# Patient Record
Sex: Male | Born: 1965 | Hispanic: No | Marital: Married | Smoking: Never smoker
Health system: Southern US, Community
[De-identification: ages and names within clinical notes are randomized; demographics above are authoritative.]

## PROBLEM LIST (undated history)

## (undated) DIAGNOSIS — I10 Essential (primary) hypertension: Secondary | ICD-10-CM

## (undated) DIAGNOSIS — E119 Type 2 diabetes mellitus without complications: Secondary | ICD-10-CM

## (undated) HISTORY — PX: ANKLE SURGERY: SHX546

## (undated) HISTORY — PX: SHOULDER SURGERY: SHX246

## (undated) HISTORY — DX: Type 2 diabetes mellitus without complications: E11.9

---

## 2016-06-03 DIAGNOSIS — M25512 Pain in left shoulder: Secondary | ICD-10-CM | POA: Diagnosis not present

## 2016-06-03 DIAGNOSIS — I10 Essential (primary) hypertension: Secondary | ICD-10-CM | POA: Diagnosis not present

## 2016-06-03 DIAGNOSIS — R209 Unspecified disturbances of skin sensation: Secondary | ICD-10-CM | POA: Diagnosis not present

## 2016-06-03 DIAGNOSIS — M47812 Spondylosis without myelopathy or radiculopathy, cervical region: Secondary | ICD-10-CM | POA: Diagnosis not present

## 2016-06-03 DIAGNOSIS — M19011 Primary osteoarthritis, right shoulder: Secondary | ICD-10-CM | POA: Diagnosis not present

## 2016-06-09 DIAGNOSIS — M19012 Primary osteoarthritis, left shoulder: Secondary | ICD-10-CM | POA: Diagnosis not present

## 2016-06-09 DIAGNOSIS — M7582 Other shoulder lesions, left shoulder: Secondary | ICD-10-CM | POA: Diagnosis not present

## 2016-06-09 DIAGNOSIS — R600 Localized edema: Secondary | ICD-10-CM | POA: Diagnosis not present

## 2016-08-29 ENCOUNTER — Ambulatory Visit
Admission: RE | Admit: 2016-08-29 | Discharge: 2016-08-29 | Disposition: A | Discharge status: Home / Self Care | Payer: Medicaid Other | Source: Ambulatory Visit | Attending: Cardiovascular Disease | Admitting: Cardiovascular Disease

## 2016-08-29 ENCOUNTER — Other Ambulatory Visit: Payer: Self-pay | Admitting: Cardiovascular Disease

## 2016-08-29 DIAGNOSIS — R51 Headache: Secondary | ICD-10-CM | POA: Diagnosis not present

## 2016-08-29 DIAGNOSIS — G8929 Other chronic pain: Secondary | ICD-10-CM

## 2016-12-23 DIAGNOSIS — I10 Essential (primary) hypertension: Secondary | ICD-10-CM | POA: Diagnosis not present

## 2016-12-23 DIAGNOSIS — R040 Epistaxis: Secondary | ICD-10-CM | POA: Diagnosis not present

## 2016-12-23 DIAGNOSIS — R072 Precordial pain: Secondary | ICD-10-CM | POA: Diagnosis not present

## 2016-12-26 DIAGNOSIS — N429 Disorder of prostate, unspecified: Secondary | ICD-10-CM | POA: Diagnosis not present

## 2016-12-26 DIAGNOSIS — E785 Hyperlipidemia, unspecified: Secondary | ICD-10-CM | POA: Diagnosis not present

## 2016-12-26 DIAGNOSIS — Z79899 Other long term (current) drug therapy: Secondary | ICD-10-CM | POA: Diagnosis not present

## 2016-12-26 DIAGNOSIS — D649 Anemia, unspecified: Secondary | ICD-10-CM | POA: Diagnosis not present

## 2016-12-26 DIAGNOSIS — E559 Vitamin D deficiency, unspecified: Secondary | ICD-10-CM | POA: Diagnosis not present

## 2016-12-26 DIAGNOSIS — E876 Hypokalemia: Secondary | ICD-10-CM | POA: Diagnosis not present

## 2017-02-16 DIAGNOSIS — R072 Precordial pain: Secondary | ICD-10-CM | POA: Diagnosis not present

## 2017-02-16 DIAGNOSIS — R040 Epistaxis: Secondary | ICD-10-CM | POA: Diagnosis not present

## 2017-02-16 DIAGNOSIS — I10 Essential (primary) hypertension: Secondary | ICD-10-CM | POA: Diagnosis not present

## 2017-02-20 ENCOUNTER — Encounter (HOSPITAL_COMMUNITY): Payer: Self-pay | Admitting: *Deleted

## 2017-02-20 ENCOUNTER — Ambulatory Visit (HOSPITAL_COMMUNITY)
Admission: EM | Admit: 2017-02-20 | Discharge: 2017-02-20 | Disposition: A | Discharge status: Home / Self Care | Payer: Medicaid Other | Attending: Internal Medicine | Admitting: Internal Medicine

## 2017-02-20 DIAGNOSIS — M542 Cervicalgia: Secondary | ICD-10-CM

## 2017-02-20 DIAGNOSIS — R040 Epistaxis: Secondary | ICD-10-CM | POA: Diagnosis not present

## 2017-02-20 DIAGNOSIS — S39012A Strain of muscle, fascia and tendon of lower back, initial encounter: Secondary | ICD-10-CM

## 2017-02-20 HISTORY — DX: Essential (primary) hypertension: I10

## 2017-02-20 MED ORDER — NAPROXEN 500 MG PO TABS: 500.0000 mg | ORAL_TABLET | Freq: Two times a day (BID) | ORAL | 0 refills | Status: DC

## 2017-02-20 MED ORDER — OXYMETAZOLINE HCL 0.05 % NA SOLN: 1.0000 | Freq: Two times a day (BID) | NASAL | 0 refills | Status: DC

## 2017-02-20 MED ORDER — CYCLOBENZAPRINE HCL 10 MG PO TABS: 10.0000 mg | ORAL_TABLET | Freq: Two times a day (BID) | ORAL | 0 refills | Status: DC | PRN

## 2017-02-20 NOTE — ED Provider Notes (Signed)
CSN: [id number]     Arrival date & time 02/20/17  1202 History   None    Chief Complaint  Patient presents with  . Headache   (Consider location/radiation/quality/duration/timing/severity/associated sxs/prior Treatment) Patient c/o cervicalgia, back discomfort and nose bleed earlier today but has resolved.  He c/o headache.   The history is provided by the patient.  Headache  Pain location:  Occipital Quality:  Dull Severity currently:  5/10 Severity at highest:  5/10 Onset quality:  Sudden Duration:  1 week Timing:  Constant Chronicity:  New Similar to prior headaches: no   Relieved by:  None tried   Past Medical History:  Diagnosis Date  . Hypertension    Past Surgical History:  Procedure Laterality Date  . SHOULDER SURGERY     No family history on file. Social History  Substance Use Topics  . Smoking status: Never Smoker  . Smokeless tobacco: Not on file  . Alcohol use No    Review of Systems  Constitutional: Negative.   HENT: Negative.   Eyes: Negative.   Respiratory: Negative.   Cardiovascular: Negative.   Gastrointestinal: Negative.   Endocrine: Negative.   Genitourinary: Negative.   Musculoskeletal: Positive for arthralgias.  Skin: Negative.   Allergic/Immunologic: Negative.   Neurological: Positive for headaches.  Hematological: Negative.   Psychiatric/Behavioral: Negative.     Allergies  Patient has no known allergies.  Home Medications   Prior to Admission medications   Medication Sig Start Date End Date Taking? Authorizing Provider  cyclobenzaprine (FLEXERIL) 10 MG tablet Take 1 tablet (10 mg total) by mouth 2 (two) times daily as needed for muscle spasms. 02/20/17   Deatra Canter, FNP  naproxen (NAPROSYN) 500 MG tablet Take 1 tablet (500 mg total) by mouth 2 (two) times daily with a meal. 02/20/17   Inri Sobieski, Anselm Pancoast, FNP  oxymetazoline (AFRIN NASAL SPRAY) 0.05 % nasal spray Place 1 spray into both nostrils 2 (two) times daily. 02/20/17    Deatra Canter, FNP   Meds Ordered and Administered this Visit  Medications - No data to display  BP 108/62 (BP Location: Right Arm)   Pulse 78   Temp 98.6 F (37 C) (Oral)   Resp 18   SpO2 100%  No data found.   Physical Exam  Constitutional: He is oriented to person, place, and time. He appears well-developed and well-nourished.  HENT:  Head: Normocephalic and atraumatic.  Right Ear: External ear normal.  Left Ear: External ear normal.  Nose: Nose normal.  Mouth/Throat: Oropharynx is clear and moist.  Eyes: Conjunctivae and EOM are normal. Pupils are equal, round, and reactive to light.  Neck: Normal range of motion. Neck supple.  Cardiovascular: Normal rate, regular rhythm and normal heart sounds.   Pulmonary/Chest: Effort normal and breath sounds normal.  Musculoskeletal: He exhibits tenderness.  TTP bilateral cervical paraspinous muscles and TTP bilateral lumbar paraspinous muscles.  Neurological: He is alert and oriented to person, place, and time.  Nursing note and vitals reviewed.   Urgent Care Course     Procedures (including critical care time)  Labs Review Labs Reviewed - No data to display  Imaging Review No results found.   Visual Acuity Review  Right Eye Distance:   Left Eye Distance:   Bilateral Distance:    Right Eye Near:   Left Eye Near:    Bilateral Near:         MDM   1. Bleeding from the nose   2. Cervicalgia  3. Strain of lumbar region, initial encounter    Naprosyn 500mg  one po bid x10 days Flexeril 10 mg one po bid prn #20  Afrin nasal sprays 1-3 sprays per nostril bid prn nose bleed.  Explained at this time do not blow nose and can use the afrin and if nose bleed does not stop go to ED.      Deatra CanterOxford, Jaquelin Meaney J, FNP 02/20/17 1336

## 2017-02-20 NOTE — ED Triage Notes (Signed)
headcahe   And  Nosebleed   X  1   Week   First  Time   With     Nosebleed   Bleed earlier  Today         Pt  Reports  The  Bleeding  Is  Non  Stop

## 2017-02-20 NOTE — ED Triage Notes (Signed)
Pt   Was   Taking   bp meds   But  Left  Them  At  Home    And   Did  Not  Bring       Pt  Took  His  bp meds  Today

## 2017-02-24 DIAGNOSIS — I10 Essential (primary) hypertension: Secondary | ICD-10-CM | POA: Diagnosis not present

## 2017-02-24 DIAGNOSIS — R040 Epistaxis: Secondary | ICD-10-CM | POA: Diagnosis not present

## 2017-02-24 DIAGNOSIS — R072 Precordial pain: Secondary | ICD-10-CM | POA: Diagnosis not present

## 2017-03-11 DIAGNOSIS — R040 Epistaxis: Secondary | ICD-10-CM | POA: Diagnosis not present

## 2017-08-17 ENCOUNTER — Encounter (HOSPITAL_COMMUNITY): Payer: Self-pay | Admitting: Family Medicine

## 2017-08-17 ENCOUNTER — Ambulatory Visit (HOSPITAL_COMMUNITY)
Admission: EM | Admit: 2017-08-17 | Discharge: 2017-08-17 | Disposition: A | Discharge status: Home / Self Care | Payer: Medicare Other | Attending: Emergency Medicine | Admitting: Emergency Medicine

## 2017-08-17 DIAGNOSIS — G44209 Tension-type headache, unspecified, not intractable: Secondary | ICD-10-CM | POA: Diagnosis not present

## 2017-08-17 DIAGNOSIS — H6121 Impacted cerumen, right ear: Secondary | ICD-10-CM

## 2017-08-17 DIAGNOSIS — H8111 Benign paroxysmal vertigo, right ear: Secondary | ICD-10-CM | POA: Diagnosis not present

## 2017-08-17 DIAGNOSIS — H9201 Otalgia, right ear: Secondary | ICD-10-CM

## 2017-08-17 MED ORDER — NAPROXEN 375 MG PO TABS: 375.0000 mg | ORAL_TABLET | Freq: Two times a day (BID) | ORAL | 0 refills | Status: DC

## 2017-08-17 MED ORDER — MECLIZINE HCL 25 MG PO TABS: 25.0000 mg | ORAL_TABLET | Freq: Three times a day (TID) | ORAL | 0 refills | Status: DC | PRN

## 2017-08-17 NOTE — Discharge Instructions (Signed)
Take medications as directed. Take the medicine for headache with food. Drink plenty fluids and stay well-hydrated. Follow up with your doctor as needed in the next few days.

## 2017-08-17 NOTE — ED Triage Notes (Signed)
Pt here for right ear pain, headache, dizziness and weakness. sts some nausea and fever.

## 2017-08-17 NOTE — ED Provider Notes (Signed)
[facility]    CSN: [id number] Arrival date & time: 08/17/17  1445     History   Chief Complaint Chief Complaint  Patient presents with  . Otalgia  . Hearing Problem    HPI Todd Ochoa is a 51 y.o. male.   Via interpreter in the room patient is a 51 year old male complaining of pain in the right ear with decrease in hearing. Is also having a headache starting from the posterior neck around the occiput toward the vertex of the head.  He feels weak. Symptoms  ongoing for about 10 days. Denies fever or chills. No sore throat. Complains of dizziness and occasionally feeling of room spinning particularly with head movement and when lying supine.      Past Medical History:  Diagnosis Date  . Hypertension     There are no active problems to display for this patient.   Past Surgical History:  Procedure Laterality Date  . SHOULDER SURGERY         Home Medications    Prior to Admission medications   Medication Sig Start Date End Date Taking? Authorizing Provider  meclizine (ANTIVERT) 25 MG tablet Take 1 tablet (25 mg total) by mouth 3 (three) times daily as needed for dizziness. 08/17/17   Hayden RasmussenMabe, Jamin Panther, NP  naproxen (NAPROSYN) 375 MG tablet Take 1 tablet (375 mg total) by mouth 2 (two) times daily. Prn headache 08/17/17   Hayden RasmussenMabe, Charron Coultas, NP  oxymetazoline (AFRIN NASAL SPRAY) 0.05 % nasal spray Place 1 spray into both nostrils 2 (two) times daily. 02/20/17   Deatra Canterxford, William J, FNP    Family History History reviewed. No pertinent family history.  Social History Social History   Tobacco Use  . Smoking status: Never Smoker  Substance Use Topics  . Alcohol use: No  . Drug use: Not on file     Allergies   Patient has no known allergies.   Review of Systems Review of Systems  Constitutional: Positive for activity change. Negative for fatigue and fever.  HENT: Positive for ear pain. Negative for congestion and sore throat.   Respiratory: Negative.     Cardiovascular: Negative.   Musculoskeletal: Positive for neck pain.  Neurological: Positive for dizziness and headaches.  All other systems reviewed and are negative.    Physical Exam Triage Vital Signs ED Triage Vitals  Enc Vitals Group     BP 08/17/17 1528 104/89     Pulse Rate 08/17/17 1528 65     Resp 08/17/17 1528 18     Temp 08/17/17 1528 98.1 F (36.7 C)     Temp src --      SpO2 08/17/17 1528 100 %     Weight --      Height --      Head Circumference --      Peak Flow --      Pain Score 08/17/17 1525 7     Pain Loc --      Pain Edu? --      Excl. in GC? --    No data found.  Updated Vital Signs BP 104/89   Pulse 65   Temp 98.1 F (36.7 C)   Resp 18   SpO2 100%   Visual Acuity Right Eye Distance:   Left Eye Distance:   Bilateral Distance:    Right Eye Near:   Left Eye Near:    Bilateral Near:     Physical Exam  Constitutional: He appears well-developed and well-nourished. No distress.  HENT:  Mouth/Throat: Oropharynx is clear and moist. No oropharyngeal exudate.  Left EAC with loose cerumen with minimal visualization the left ear. Right EAC with cerumen impaction and unable to visualize TM.  Oropharynx is clear.  Eyes: EOM are normal.  Neck: Normal range of motion. Neck supple.  No cervical tenderness  Cardiovascular: Normal rate, regular rhythm, normal heart sounds and intact distal pulses.  Pulmonary/Chest: Effort normal and breath sounds normal. No stridor. No respiratory distress. He has no wheezes.  Musculoskeletal: He exhibits no edema.  Lymphadenopathy:    He has no cervical adenopathy.  Neurological: He is alert.  Skin: Skin is warm and dry. No rash noted.  Nursing note and vitals reviewed.    UC Treatments / Results  Labs (all labs ordered are listed, but only abnormal results are displayed) Labs Reviewed - No data to display  EKG  EKG Interpretation None       Radiology No results found.  Procedures Procedures  (including critical care time)  Medications Ordered in UC Medications - No data to display   Initial Impression / Assessment and Plan / UC Course  I have reviewed the triage vital signs and the nursing notes.  Pertinent labs & imaging results that were available during my care of the patient were reviewed by me and considered in my medical decision making (see chart for details).    Take medications as directed. Take the medicine for headache with food. Drink plenty fluids and stay well-hydrated. Follow up with your doctor as needed in the next few days.  Right ear canal was cleaned well. TM a little dull. No evidence of effusion or infection. The patient does complain of tinnitus.    Final Clinical Impressions(s) / UC Diagnoses   Final diagnoses:  Right ear pain  Impacted cerumen of right ear  BPV (benign positional vertigo), right  Tension headache    ED Discharge Orders        Ordered    naproxen (NAPROSYN) 375 MG tablet  2 times daily     08/17/17 1727    meclizine (ANTIVERT) 25 MG tablet  3 times daily PRN     08/17/17 1727       Controlled Substance Prescriptions [place] Controlled Substance Registry consulted? Not Applicable   Hayden RasmussenMabe, Meleana Commerford, NP 08/17/17 1730

## 2017-11-05 ENCOUNTER — Other Ambulatory Visit: Payer: Self-pay

## 2017-11-05 ENCOUNTER — Ambulatory Visit (HOSPITAL_COMMUNITY)
Admission: EM | Admit: 2017-11-05 | Discharge: 2017-11-05 | Disposition: A | Discharge status: Home / Self Care | Payer: Medicare Other | Attending: Family Medicine | Admitting: Family Medicine

## 2017-11-05 ENCOUNTER — Encounter (HOSPITAL_COMMUNITY): Payer: Self-pay | Admitting: Emergency Medicine

## 2017-11-05 DIAGNOSIS — H8110 Benign paroxysmal vertigo, unspecified ear: Secondary | ICD-10-CM | POA: Diagnosis not present

## 2017-11-05 DIAGNOSIS — H6691 Otitis media, unspecified, right ear: Secondary | ICD-10-CM | POA: Diagnosis not present

## 2017-11-05 MED ORDER — MECLIZINE HCL 25 MG PO TABS: 25.0000 mg | ORAL_TABLET | Freq: Three times a day (TID) | ORAL | 0 refills | Status: DC | PRN

## 2017-11-05 MED ORDER — AMOXICILLIN 500 MG PO CAPS: 500.0000 mg | ORAL_CAPSULE | Freq: Two times a day (BID) | ORAL | 0 refills | Status: AC

## 2017-11-05 NOTE — ED Provider Notes (Signed)
[facility]    CSN: [id number] Arrival date & time: 11/05/17  1001     History   Chief Complaint Chief Complaint  Patient presents with  . Otalgia    HPI Todd Ochoa is a 52 y.o. male.   Subjective:   Todd Ochoa is a 52 y.o. male who presents with right ear pain. He also endorses nausea, headache and feeling off balance at times. Onset of symptoms was 2 weeks ago and has been unchanged since that time. Patient also reports an episode of dizziness with fall 2 days ago. He denies any difficulty hearing, congestion, rhinorrhea, sore throat, loss of consciousness, confusion limb paraesthesias, unilateral weakness or slurred speech.  No neck or back pain. He is drinking plenty of fluids  The following portions of the patient's history were reviewed and updated as appropriate: allergies, current medications, past family history, past medical history, past social history, past surgical history and problem list.         Past Medical History:  Diagnosis Date  . Hypertension     There are no active problems to display for this patient.   Past Surgical History:  Procedure Laterality Date  . SHOULDER SURGERY         Home Medications    Prior to Admission medications   Medication Sig Start Date End Date Taking? Authorizing Provider  meclizine (ANTIVERT) 25 MG tablet Take 1 tablet (25 mg total) by mouth 3 (three) times daily as needed for dizziness. 08/17/17   Hayden Rasmussen, NP  naproxen (NAPROSYN) 375 MG tablet Take 1 tablet (375 mg total) by mouth 2 (two) times daily. Prn headache 08/17/17   Hayden Rasmussen, NP  oxymetazoline (AFRIN NASAL SPRAY) 0.05 % nasal spray Place 1 spray into both nostrils 2 (two) times daily. 02/20/17   Deatra Canter, FNP    Family History No family history on file.  Social History Social History   Tobacco Use  . Smoking status: Never Smoker  . Smokeless tobacco: Never Used  Substance Use Topics  . Alcohol use: No  . Drug use:  Never     Allergies   Patient has no known allergies.   Review of Systems Review of Systems  Constitutional: Negative for fever.  HENT: Positive for ear pain. Negative for congestion, ear discharge, rhinorrhea, sneezing and tinnitus.   Respiratory: Negative for cough and shortness of breath.   Cardiovascular: Negative for chest pain.  Neurological: Positive for dizziness and headaches. Negative for tremors, seizures, syncope, facial asymmetry, speech difficulty, weakness and numbness.  All other systems reviewed and are negative.    Physical Exam Triage Vital Signs ED Triage Vitals [11/05/17 1030]  Enc Vitals Group     BP 124/82     Pulse Rate 65     Resp 16     Temp 97.7 F (36.5 C)     Temp Source Oral     SpO2 100 %     Weight      Height      Head Circumference      Peak Flow      Pain Score      Pain Loc      Pain Edu?      Excl. in GC?    No data found.  Updated Vital Signs BP 124/82 (BP Location: Left Arm)   Pulse 65   Temp 97.7 F (36.5 C) (Oral)   Resp 16   SpO2 100%   Visual Acuity Right Eye  Distance:   Left Eye Distance:   Bilateral Distance:    Right Eye Near:   Left Eye Near:    Bilateral Near:     Physical Exam  Constitutional: He is oriented to person, place, and time. He appears well-developed and well-nourished.  HENT:  Head: Normocephalic and atraumatic.  Left Ear: External ear normal.  Nose: Nose normal.  Mouth/Throat: Oropharynx is clear and moist.  Right TM redness   Eyes: Pupils are equal, round, and reactive to light. Conjunctivae and EOM are normal.  Neck: Normal range of motion. Neck supple.  Cardiovascular: Normal rate, regular rhythm and normal heart sounds.  Pulmonary/Chest: Effort normal and breath sounds normal.  Musculoskeletal: Normal range of motion.  Neurological: He is alert and oriented to person, place, and time. No cranial nerve deficit or sensory deficit. Coordination normal.  Skin: Skin is warm and dry.    Psychiatric: He has a normal mood and affect.     UC Treatments / Results  Labs (all labs ordered are listed, but only abnormal results are displayed) Labs Reviewed - No data to display  EKG  EKG Interpretation None       Radiology No results found.  Procedures Procedures (including critical care time)  Medications Ordered in UC Medications - No data to display   Initial Impression / Assessment and Plan / UC Course  I have reviewed the triage vital signs and the nursing notes.  Pertinent labs & imaging results that were available during my care of the patient were reviewed by me and considered in my medical decision making (see chart for details).     52 year old male presenting with right ear pain, nausea, headache and dizziness. Right TM redness consistent with acute otitis media. No focal neuro deficits noted on exam. Will treat with amoxicillin as well as meclizine as needed. Discussed diagnosis and treatment with patient. All questions have been answered and all concerns have been addressed. The patient verbalized understanding and had no further questions   Final Clinical Impressions(s) / UC Diagnoses   Final diagnoses:  Right otitis media, unspecified otitis media type  Benign paroxysmal positional vertigo, unspecified laterality    ED Discharge Orders    None       Controlled Substance Prescriptions [place] Controlled Substance Registry consulted? Not Applicable   Lurline IdolMurrill, Vondell Sowell, FNP 11/05/17 1120

## 2017-11-05 NOTE — ED Triage Notes (Signed)
C/o right ear pain, HA and dizziness onset "over a year"

## 2018-06-04 ENCOUNTER — Emergency Department (HOSPITAL_COMMUNITY): Payer: Medicare Other

## 2018-06-04 ENCOUNTER — Encounter (HOSPITAL_COMMUNITY): Payer: Self-pay | Admitting: Emergency Medicine

## 2018-06-04 ENCOUNTER — Emergency Department (HOSPITAL_COMMUNITY)
Admission: EM | Admit: 2018-06-04 | Discharge: 2018-06-04 | Disposition: A | Discharge status: Home / Self Care | Payer: Medicare Other | Attending: Emergency Medicine | Admitting: Emergency Medicine

## 2018-06-04 DIAGNOSIS — I10 Essential (primary) hypertension: Secondary | ICD-10-CM | POA: Insufficient documentation

## 2018-06-04 DIAGNOSIS — Y998 Other external cause status: Secondary | ICD-10-CM | POA: Insufficient documentation

## 2018-06-04 DIAGNOSIS — R0789 Other chest pain: Secondary | ICD-10-CM | POA: Diagnosis not present

## 2018-06-04 DIAGNOSIS — S0990XA Unspecified injury of head, initial encounter: Secondary | ICD-10-CM | POA: Diagnosis not present

## 2018-06-04 DIAGNOSIS — R42 Dizziness and giddiness: Secondary | ICD-10-CM | POA: Insufficient documentation

## 2018-06-04 DIAGNOSIS — Y93I9 Activity, other involving external motion: Secondary | ICD-10-CM | POA: Diagnosis not present

## 2018-06-04 DIAGNOSIS — S161XXA Strain of muscle, fascia and tendon at neck level, initial encounter: Secondary | ICD-10-CM | POA: Insufficient documentation

## 2018-06-04 DIAGNOSIS — S199XXA Unspecified injury of neck, initial encounter: Secondary | ICD-10-CM | POA: Diagnosis not present

## 2018-06-04 DIAGNOSIS — S299XXA Unspecified injury of thorax, initial encounter: Secondary | ICD-10-CM | POA: Diagnosis not present

## 2018-06-04 DIAGNOSIS — Y92410 Unspecified street and highway as the place of occurrence of the external cause: Secondary | ICD-10-CM | POA: Diagnosis not present

## 2018-06-04 DIAGNOSIS — R079 Chest pain, unspecified: Secondary | ICD-10-CM | POA: Diagnosis not present

## 2018-06-04 DIAGNOSIS — M542 Cervicalgia: Secondary | ICD-10-CM | POA: Diagnosis not present

## 2018-06-04 MED ORDER — TRAMADOL HCL 50 MG PO TABS: 50.0000 mg | ORAL_TABLET | Freq: Four times a day (QID) | ORAL | 0 refills | Status: DC | PRN

## 2018-06-04 MED ORDER — IBUPROFEN 800 MG PO TABS: 800.0000 mg | ORAL_TABLET | Freq: Three times a day (TID) | ORAL | 0 refills | Status: DC | PRN

## 2018-06-04 MED ORDER — IBUPROFEN 800 MG PO TABS
800.0000 mg | ORAL_TABLET | Freq: Once | ORAL | Status: AC
  Administered 2018-06-04: 800 mg via ORAL
  Filled 2018-06-04: qty 1
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
## 2018-06-04 NOTE — Discharge Instructions (Signed)
Your CT scans and x-rays were normal.  Return here as needed.  Follow-up with her primary doctor.
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
## 2022-02-14 ENCOUNTER — Emergency Department (HOSPITAL_COMMUNITY): Payer: Medicare Other

## 2022-02-14 ENCOUNTER — Inpatient Hospital Stay (HOSPITAL_COMMUNITY)
Admission: EM | Admit: 2022-02-14 | Discharge: 2022-02-17 | DRG: 419 | Disposition: A | Discharge status: Home / Self Care | Payer: Medicare Other | Attending: Surgery | Admitting: Surgery

## 2022-02-14 ENCOUNTER — Encounter (HOSPITAL_COMMUNITY): Payer: Self-pay | Admitting: *Deleted

## 2022-02-14 ENCOUNTER — Ambulatory Visit (HOSPITAL_COMMUNITY)
Admission: EM | Admit: 2022-02-14 | Discharge: 2022-02-14 | Disposition: A | Discharge status: Home / Self Care | Payer: Medicare Other | Attending: Student | Admitting: Student

## 2022-02-14 ENCOUNTER — Other Ambulatory Visit: Payer: Self-pay

## 2022-02-14 ENCOUNTER — Encounter (HOSPITAL_COMMUNITY): Payer: Self-pay

## 2022-02-14 DIAGNOSIS — K810 Acute cholecystitis: Secondary | ICD-10-CM | POA: Diagnosis not present

## 2022-02-14 DIAGNOSIS — K82A1 Gangrene of gallbladder in cholecystitis: Secondary | ICD-10-CM | POA: Diagnosis present

## 2022-02-14 DIAGNOSIS — K819 Cholecystitis, unspecified: Principal | ICD-10-CM

## 2022-02-14 DIAGNOSIS — R1011 Right upper quadrant pain: Secondary | ICD-10-CM

## 2022-02-14 DIAGNOSIS — R079 Chest pain, unspecified: Secondary | ICD-10-CM | POA: Diagnosis not present

## 2022-02-14 DIAGNOSIS — I10 Essential (primary) hypertension: Secondary | ICD-10-CM | POA: Diagnosis present

## 2022-02-14 DIAGNOSIS — K8020 Calculus of gallbladder without cholecystitis without obstruction: Secondary | ICD-10-CM | POA: Diagnosis not present

## 2022-02-14 DIAGNOSIS — K8000 Calculus of gallbladder with acute cholecystitis without obstruction: Secondary | ICD-10-CM | POA: Diagnosis not present

## 2022-02-14 DIAGNOSIS — R1013 Epigastric pain: Secondary | ICD-10-CM | POA: Diagnosis not present

## 2022-02-14 LAB — CBC
HCT: 48.9 % (ref 39.0–52.0)
Hemoglobin: 16.3 g/dL (ref 13.0–17.0)
MCH: 29.1 pg (ref 26.0–34.0)
MCHC: 33.3 g/dL (ref 30.0–36.0)
MCV: 87.2 fL (ref 80.0–100.0)
Platelets: 208 10*3/uL (ref 150–400)
RBC: 5.61 MIL/uL (ref 4.22–5.81)
RDW: 12.3 % (ref 11.5–15.5)
WBC: 10.9 10*3/uL — ABNORMAL HIGH (ref 4.0–10.5)
nRBC: 0 % (ref 0.0–0.2)

## 2022-02-14 LAB — URINALYSIS, ROUTINE W REFLEX MICROSCOPIC
Bacteria, UA: NONE SEEN
Bilirubin Urine: NEGATIVE
Glucose, UA: 50 mg/dL — AB
Hgb urine dipstick: NEGATIVE
Ketones, ur: 5 mg/dL — AB
Leukocytes,Ua: NEGATIVE
Nitrite: NEGATIVE
Protein, ur: 30 mg/dL — AB
Specific Gravity, Urine: 1.03 (ref 1.005–1.030)
pH: 7 (ref 5.0–8.0)

## 2022-02-14 LAB — COMPREHENSIVE METABOLIC PANEL
ALT: 19 U/L (ref 0–44)
AST: 20 U/L (ref 15–41)
Albumin: 4.1 g/dL (ref 3.5–5.0)
Alkaline Phosphatase: 55 U/L (ref 38–126)
Anion gap: 9 (ref 5–15)
BUN: 8 mg/dL (ref 6–20)
CO2: 27 mmol/L (ref 22–32)
Calcium: 9.1 mg/dL (ref 8.9–10.3)
Chloride: 104 mmol/L (ref 98–111)
Creatinine, Ser: 0.74 mg/dL (ref 0.61–1.24)
GFR, Estimated: >60 mL/min (ref >60)
Glucose, Bld: 129 mg/dL — ABNORMAL HIGH (ref 70–99)
Potassium: 3.4 mmol/L — ABNORMAL LOW (ref 3.5–5.1)
Sodium: 140 mmol/L (ref 135–145)
Total Bilirubin: 0.5 mg/dL (ref 0.3–1.2)
Total Protein: 7.6 g/dL (ref 6.5–8.1)

## 2022-02-14 LAB — TROPONIN I (HIGH SENSITIVITY)
Troponin I (High Sensitivity): 10 ng/L (ref <18)
Troponin I (High Sensitivity): 7 ng/L (ref <18)

## 2022-02-14 LAB — LIPASE, BLOOD: Lipase: 32 U/L (ref 11–51)

## 2022-02-14 MED ORDER — PIPERACILLIN-TAZOBACTAM 3.375 G IVPB 30 MIN
3.3750 g | Freq: Once | INTRAVENOUS | Status: AC
  Administered 2022-02-14: 3.375 g via INTRAVENOUS
  Filled 2022-02-14: qty 50

## 2022-02-14 MED ORDER — DIPHENHYDRAMINE HCL 50 MG/ML IJ SOLN: 25.0000 mg | Freq: Four times a day (QID) | INTRAMUSCULAR | Status: DC | PRN

## 2022-02-14 MED ORDER — FENTANYL CITRATE PF 50 MCG/ML IJ SOSY
50.0000 ug | PREFILLED_SYRINGE | Freq: Once | INTRAMUSCULAR | Status: AC
  Administered 2022-02-14: 50 ug via INTRAVENOUS
  Filled 2022-02-14: qty 1

## 2022-02-14 MED ORDER — ONDANSETRON HCL 4 MG/2ML IJ SOLN: 4.0000 mg | Freq: Four times a day (QID) | INTRAMUSCULAR | Status: DC | PRN

## 2022-02-14 MED ORDER — PIPERACILLIN-TAZOBACTAM 3.375 G IVPB
3.3750 g | Freq: Three times a day (TID) | INTRAVENOUS | Status: DC
  Administered 2022-02-15 – 2022-02-17 (×7): 3.375 g via INTRAVENOUS
  Filled 2022-02-14 (×7): qty 50

## 2022-02-14 MED ORDER — METOPROLOL TARTRATE 5 MG/5ML IV SOLN: 5.0000 mg | Freq: Four times a day (QID) | INTRAVENOUS | Status: DC | PRN

## 2022-02-14 MED ORDER — METHOCARBAMOL 1000 MG/10ML IJ SOLN: 500.0000 mg | Freq: Four times a day (QID) | INTRAVENOUS | Status: DC | PRN

## 2022-02-14 MED ORDER — TRAMADOL HCL 50 MG PO TABS
50.0000 mg | ORAL_TABLET | Freq: Four times a day (QID) | ORAL | Status: DC | PRN
  Administered 2022-02-17: 50 mg via ORAL
  Filled 2022-02-14: qty 1

## 2022-02-14 MED ORDER — PROCHLORPERAZINE EDISYLATE 10 MG/2ML IJ SOLN: 5.0000 mg | Freq: Four times a day (QID) | INTRAMUSCULAR | Status: DC | PRN

## 2022-02-14 MED ORDER — MORPHINE SULFATE (PF) 4 MG/ML IV SOLN
4.0000 mg | Freq: Once | INTRAVENOUS | Status: AC
  Administered 2022-02-14: 4 mg via INTRAVENOUS
  Filled 2022-02-14: qty 1

## 2022-02-14 MED ORDER — HYDRALAZINE HCL 20 MG/ML IJ SOLN: 10.0000 mg | INTRAMUSCULAR | Status: DC | PRN

## 2022-02-14 MED ORDER — OXYCODONE-ACETAMINOPHEN 5-325 MG PO TABS
1.0000 | ORAL_TABLET | Freq: Once | ORAL | Status: AC
  Administered 2022-02-14: 1 via ORAL
  Filled 2022-02-14: qty 1

## 2022-02-14 MED ORDER — ONDANSETRON HCL 4 MG/2ML IJ SOLN
4.0000 mg | Freq: Once | INTRAMUSCULAR | Status: AC
  Administered 2022-02-14: 4 mg via INTRAVENOUS
  Filled 2022-02-14: qty 2

## 2022-02-14 MED ORDER — HYDROMORPHONE HCL 1 MG/ML IJ SOLN
0.5000 mg | INTRAMUSCULAR | Status: DC | PRN
  Administered 2022-02-14 – 2022-02-16 (×10): 1 mg via INTRAVENOUS
  Filled 2022-02-14 (×10): qty 1

## 2022-02-14 MED ORDER — BISACODYL 10 MG RE SUPP: 10.0000 mg | Freq: Every day | RECTAL | Status: DC | PRN

## 2022-02-14 MED ORDER — ONDANSETRON 4 MG PO TBDP: 4.0000 mg | ORAL_TABLET | Freq: Four times a day (QID) | ORAL | Status: DC | PRN

## 2022-02-14 MED ORDER — ENOXAPARIN SODIUM 40 MG/0.4ML IJ SOSY
40.0000 mg | PREFILLED_SYRINGE | INTRAMUSCULAR | Status: DC
  Administered 2022-02-14 – 2022-02-16 (×3): 40 mg via SUBCUTANEOUS
  Filled 2022-02-14 (×3): qty 0.4

## 2022-02-14 MED ORDER — SIMETHICONE 80 MG PO CHEW: 40.0000 mg | CHEWABLE_TABLET | Freq: Four times a day (QID) | ORAL | Status: DC | PRN

## 2022-02-14 MED ORDER — ACETAMINOPHEN 500 MG PO TABS
1000.0000 mg | ORAL_TABLET | Freq: Four times a day (QID) | ORAL | Status: DC
  Administered 2022-02-14 – 2022-02-17 (×9): 1000 mg via ORAL
  Filled 2022-02-14 (×9): qty 2

## 2022-02-14 MED ORDER — OXYCODONE HCL 5 MG PO TABS: 5.0000 mg | ORAL_TABLET | ORAL | Status: DC | PRN

## 2022-02-14 MED ORDER — PANTOPRAZOLE SODIUM 40 MG IV SOLR
40.0000 mg | Freq: Every day | INTRAVENOUS | Status: DC
  Administered 2022-02-14 – 2022-02-16 (×3): 40 mg via INTRAVENOUS
  Filled 2022-02-14 (×3): qty 10

## 2022-02-14 MED ORDER — PROCHLORPERAZINE MALEATE 10 MG PO TABS: 10.0000 mg | ORAL_TABLET | Freq: Four times a day (QID) | ORAL | Status: DC | PRN

## 2022-02-14 MED ORDER — DOCUSATE SODIUM 100 MG PO CAPS
100.0000 mg | ORAL_CAPSULE | Freq: Two times a day (BID) | ORAL | Status: DC
  Administered 2022-02-14 – 2022-02-17 (×4): 100 mg via ORAL
  Filled 2022-02-14 (×5): qty 1

## 2022-02-14 MED ORDER — KETOROLAC TROMETHAMINE 15 MG/ML IJ SOLN
15.0000 mg | Freq: Four times a day (QID) | INTRAMUSCULAR | Status: DC | PRN
  Administered 2022-02-14 – 2022-02-15 (×2): 15 mg via INTRAVENOUS
  Filled 2022-02-14 (×2): qty 1

## 2022-02-14 MED ORDER — DIPHENHYDRAMINE HCL 25 MG PO CAPS: 25.0000 mg | ORAL_CAPSULE | Freq: Four times a day (QID) | ORAL | Status: DC | PRN

## 2022-02-14 MED ORDER — SODIUM CHLORIDE 0.9 % IV SOLN: INTRAVENOUS | Status: DC

## 2022-02-14 NOTE — ED Provider Triage Note (Signed)
Emergency Medicine Provider Triage Evaluation Note  Todd Ochoa , a 56 y.o. male  was evaluated in triage.  Pt complains of 2 weeks of right upper quadrant/epigastric pain seems to radiate to his back.  It seems that his pain is primarily below the xiphoid.  He denies any shortness of breath lightheadedness or dizziness.  No nausea or vomiting.  Normal bowel movements.  No urinary issues.  Only medical problem is hypertension.  No history of strokes heart attacks or smoking.  Son is at bedside who is translating for patient  Review of Systems  Positive: Chest pain, epigastric pain Negative: Fever  Physical Exam  BP 131/88 (BP Location: Left Arm)   Pulse 72   Temp 97.9 F (36.6 C) (Oral)   Resp 20   Ht 5\' 5"  (1.651 m)   Wt 72.6 kg   SpO2 100%   BMI 26.63 kg/m  Gen:   Awake, no distress   Resp:  Normal effort  MSK:   Moves extremities without difficulty  Other:  Uncomfortable appearing 56 year old venovenous gentleman.  Significantly tender in right upper quadrant with positive Murphy sign.  Medical Decision Making  Medically screening exam initiated at 11:57 AM.  Appropriate orders placed.  Todd Ochoa was informed that the remainder of the evaluation will be completed by another provider, this initial triage assessment does not replace that evaluation, and the importance of remaining in the ED until their evaluation is complete.  [address], [address]     [place], DOLE 02/14/22 1159

## 2022-02-14 NOTE — ED Provider Notes (Signed)
[facility] EMERGENCY DEPARTMENT Provider Note   CSN: [id number] Arrival date & time: 02/14/22  1130     History  Chief Complaint  Patient presents with   Chest Pain    Todd Ochoa is a 56 y.o. male with a past medical history of hypertension.  Patient was seen at urgent care earlier today and sent to the emergency department for further evaluation.  Patient presents to the emergency department with a complaint of right upper quadrant abdominal pain.  Pain has been constant over the last 2 weeks.  Pain is worse over the last 24 hours.  Patient reports that pain in radiates to his back.  Patient has not noticed any aggravating factors or alleviating factors.  Patient has been eating and drinking as normal.  Does report some decreased urinary output as well as decreased stool production.  Denies any fever, chills, nausea, vomiting, constipation, diarrhea, blood in stool, melena, dysuria, hematuria, urinary urgency, swelling or tenderness to genitals.  Patient denies any illicit drug or alcohol use.  No previous history of abdominal surgeries.  No blood thinner use.  Last oral intake was 7:00 this morning.   Chest Pain Associated symptoms: abdominal pain   Associated symptoms: no back pain, no dizziness, no fever, no headache, no nausea, no shortness of breath and no vomiting        Home Medications Prior to Admission medications   Medication Sig Start Date End Date Taking? Authorizing Provider  ibuprofen (ADVIL,MOTRIN) 800 MG tablet Take 1 tablet (800 mg total) by mouth every 8 (eight) hours as needed. 06/04/18   Lawyer, Cristal Deer, PA-C      Allergies    Patient has no known allergies.    Review of Systems   Review of Systems  Constitutional:  Negative for chills and fever.  Eyes:  Negative for visual disturbance.  Respiratory:  Negative for shortness of breath.   Cardiovascular:  Negative for chest pain.  Gastrointestinal:  Positive for abdominal  pain. Negative for abdominal distention, anal bleeding, blood in stool, constipation, diarrhea, nausea, rectal pain and vomiting.  Genitourinary:  Positive for decreased urine volume. Negative for difficulty urinating, dysuria, flank pain, frequency, hematuria, penile pain, penile swelling, scrotal swelling and testicular pain.  Musculoskeletal:  Negative for back pain and neck pain.  Skin:  Negative for color change and rash.  Neurological:  Negative for dizziness, syncope, light-headedness and headaches.  Psychiatric/Behavioral:  Negative for confusion.     Physical Exam Updated Vital Signs BP 131/88 (BP Location: Left Arm)   Pulse 72   Temp 97.9 F (36.6 C) (Oral)   Resp 20   Ht 5\' 5"  (1.651 m)   Wt 72.6 kg   SpO2 100%   BMI 26.63 kg/m  Physical Exam Vitals and nursing note reviewed.  Constitutional:      General: He is not in acute distress.    Appearance: He is not ill-appearing, toxic-appearing or diaphoretic.  HENT:     Head: Normocephalic.  Eyes:     General: No scleral icterus.       Right eye: No discharge.        Left eye: No discharge.  Cardiovascular:     Rate and Rhythm: Normal rate.  Pulmonary:     Effort: Pulmonary effort is normal.  Abdominal:     General: Abdomen is flat. Bowel sounds are decreased. There is no distension. There are no signs of injury.     Palpations: Abdomen is soft. There is  no mass or pulsatile mass.     Tenderness: There is abdominal tenderness in the right upper quadrant. There is guarding. There is no right CVA tenderness, left CVA tenderness or rebound. Positive signs include Murphy's sign.     Hernia: There is no hernia in the umbilical area or ventral area.  Skin:    General: Skin is warm and dry.  Neurological:     General: No focal deficit present.     Mental Status: He is alert.     GCS: GCS eye subscore is 4. GCS verbal subscore is 5. GCS motor subscore is 6.  Psychiatric:        Behavior: Behavior is cooperative.      ED Results / Procedures / Treatments   Labs (all labs ordered are listed, but only abnormal results are displayed) Labs Reviewed  COMPREHENSIVE METABOLIC PANEL - Abnormal; Notable for the following components:      Result Value   Potassium 3.4 (*)    Glucose, Bld 129 (*)    All other components within normal limits  CBC - Abnormal; Notable for the following components:   WBC 10.9 (*)    All other components within normal limits  URINALYSIS, ROUTINE W REFLEX MICROSCOPIC - Abnormal; Notable for the following components:   Glucose, UA 50 (*)    Ketones, ur 5 (*)    Protein, ur 30 (*)    All other components within normal limits  LIPASE, BLOOD  TROPONIN I (HIGH SENSITIVITY)  TROPONIN I (HIGH SENSITIVITY)    EKG None  Radiology US Abdomen Limited RUQ (LIVER/GB)  Result Date: 02/14/2022 CLINICAL DATA:  Right upper quadrant pain EXAM: ULTRASOUND ABDOMEN LIMITED RIGHT UPPER QUADRANT COMPARISON:  None Available. FINDINGS: Gallbladder: Gallbladder is physiologically distended containing exudative material. 1.8 cm gallstone in the gallbladder neck. Wall thickening up to 3.3 mm. Trace pericholecystic fluid. Sonographer reported sonographic Murphy's sign. Common bile duct: Diameter: 7 mm, unremarkable. No intrahepatic biliary ductal dilatation noted. Liver: No focal lesion identified. Within normal limits in parenchymal echogenicity. Portal vein is patent on color Doppler imaging with normal direction of blood flow towards the liver. Other: None. IMPRESSION: Cholelithiasis with mild gallbladder wall thickening, pericholecystic fluid, and reported positive sonographic Murphy's sign suggesting cholecystitis. Electronically Signed   By: Corlis Leak M.D.   On: 02/14/2022 13:02   DG Chest 2 View  Result Date: 02/14/2022 CLINICAL DATA:  Chest pain EXAM: CHEST - 2 VIEW COMPARISON:  Chest radiographs 06/04/2018 FINDINGS: The cardiomediastinal silhouette is normal. There is no focal consolidation or  pulmonary edema. There is no pleural effusion or pneumothorax There is no acute osseous abnormality. Postsurgical changes of the left clavicle are unchanged. IMPRESSION: No radiographic evidence of acute cardiopulmonary process. Electronically Signed   By: Lesia Hausen M.D.   On: 02/14/2022 12:17    Procedures Procedures    Medications Ordered in ED Medications  oxyCODONE-acetaminophen (PERCOCET/ROXICET) 5-325 MG per tablet 1 tablet (1 tablet Oral Given 02/14/22 1437)    ED Course/ Medical Decision Making/ A&P Clinical Course as of 02/14/22 1735  Fri Feb 14, 2022  1734 I spoke to general surgeon Dr. Doylene Canard who will see the patient. [PB]    Clinical Course User Index [PB] Haskel Schroeder, PA-C                           Medical Decision Making Amount and/or Complexity of Data Reviewed Labs: ordered.  Risk Prescription drug management.  Decision regarding hospitalization.   Alert 56 year old male in no acute distress, nontoxic-appearing.  Patient does appear uncomfortable due to reports of right upper quadrant abdominal pain.  Presents to the ED with a complaint of right upper quadrant abdominal pain.  Information obtained from patient and patient's family member at bedside.  Patient primary language is [place] yard.  Patient was offered [facility] interpreter however requests fell #2 translate at bedside.  I reviewed patient's past medical records including previous bladder notes, labs, and imaging.  Patient has medical history of hypertension which complicates his care.  With reports of right upper quadrant ultrasound there is concern for cholecystitis versus cholelithiasis.  Ultrasound imaging and abdominal labs were obtained while patient was in triage.  I reviewed patient's lab results.  Pertinent findings include: -Leukocytosis 10.9 -Potassium 3.4 -Urinalysis unremarkable. -Troponin 10 and 7 with delta of -3, low suspicion for ACS at this time.  I personally viewed and  interpreted patient's chest x-ray.  Agree with radiology interpretation of no active cardiopulmonary disease.  I personally viewed and interpret patient's ultrasound imaging.  Agree with radiology interpretation of cholelithiasis with mild gallbladder wall thickening, pericholecystic fluid and reported positive sonographic Murphy sign.  Findings ultrasound concerning for cholecystitis.  We will start patient on Zosyn.  Patient given morphine and Zofran for pain management.  We will consult general surgery.  After seeing the patient plan is for surgery team to admit for cholecystectomy.          Final Clinical Impression(s) / ED Diagnoses Final diagnoses:  Cholecystitis    Rx / DC Orders ED Discharge Orders     None         Berneice Heinrich 02/14/22 1934    Linwood Dibbles, MD 02/16/22 [id number]

## 2022-02-14 NOTE — ED Triage Notes (Signed)
PT arrives from [facility] for eval of epigastric/chest pain that radiates through to his back x 2 weeks. Pt also reports RUQ abd involvement. Pt reports. Pt went to UC for eval and was sent here for further evaluation. Pt denies N/V.

## 2022-02-14 NOTE — H&P (Signed)
Surgical Evaluation  Chief Complaint: Abdominal pain  HPI: 56 year old otherwise healthy male who is here with his wife and son with chief complaint of 2 weeks of epigastric/right upper quadrant/chest pain radiating to the back which acutely worsened and became more focal in the right upper quadrant over the last 24 hours or so.  Has not found any alleviating or aggravating factors.  Not aggravated by position or movement.  Denies associated nausea, vomiting, or indigestion but his son notes that he has had decreased urine output and decreased bowel movements for the last day or so. He has no previous abdominal surgery. He speaks Industrial/product designer dialect and his son translates for him by his request.  No Known Allergies  Past Medical History:  Diagnosis Date   Hypertension     Past Surgical History:  Procedure Laterality Date   ANKLE SURGERY Left    SHOULDER SURGERY      History reviewed. No pertinent family history.  Social History   Socioeconomic History   Marital status: Unknown    Spouse name: Not on file   Number of children: Not on file   Years of education: Not on file   Highest education level: Not on file  Occupational History   Not on file  Tobacco Use   Smoking status: Never   Smokeless tobacco: Never  Vaping Use   Vaping Use: Never used  Substance and Sexual Activity   Alcohol use: No   Drug use: Never   Sexual activity: Not on file  Other Topics Concern   Not on file  Social History Narrative   Not on file   Social Determinants of Health   Financial Resource Strain: Not on file  Food Insecurity: Not on file  Transportation Needs: Not on file  Physical Activity: Not on file  Stress: Not on file  Social Connections: Not on file    No current facility-administered medications on file prior to encounter.   Current Outpatient Medications on File Prior to Encounter  Medication Sig Dispense Refill   ibuprofen (ADVIL,MOTRIN) 800 MG tablet Take 1  tablet (800 mg total) by mouth every 8 (eight) hours as needed. 21 tablet 0    Review of Systems: a complete, 10pt review of systems was completed with pertinent positives and negatives as documented in the HPI  Physical Exam: Vitals:   02/14/22 1815 02/14/22 1830  BP: (!) 139/92 (!) 147/86  Pulse: 68 66  Resp: 16 15  Temp:    SpO2: 98% 99%   Gen: A&Ox3, no distress but does appear uncomfortable Eyes: lids and conjunctivae normal, no icterus. Pupils equally round and reactive to light.  Neck: supple without mass or thyromegaly Chest: respiratory effort is normal. No crepitus or tenderness on palpation of the chest. Breath sounds equal.  Cardiovascular: RRR with palpable distal pulses, no pedal edema Gastrointestinal: soft, nondistended, tender in right hemiabdomen, right upper quadrant greater than right lower quadrant, with voluntary guarding.  Completely nontender across the left hemiabdomen. No mass, hepatomegaly or splenomegaly.  Lymphatic: no lymphadenopathy in the neck or groin Muscoloskeletal: no clubbing or cyanosis of the fingers.  Strength is symmetrical throughout.  Range of motion of bilateral upper and lower extremities normal without pain, crepitation or contracture. Neuro: cranial nerves grossly intact.  Sensation intact to light touch diffusely. Psych: appropriate mood and affect, normal insight/judgment intact  Skin: warm and dry      Latest Ref Rng & Units 02/14/2022   12:03 PM  CBC  WBC 4.0 -  10.5 K/uL 10.9   Hemoglobin 13.0 - 17.0 g/dL 24.2   Hematocrit 68.3 - 52.0 % 48.9   Platelets 150 - 400 K/uL 208        Latest Ref Rng & Units 02/14/2022   12:03 PM  CMP  Glucose 70 - 99 mg/dL 419   BUN 6 - 20 mg/dL 8   Creatinine 6.22 - 2.97 mg/dL 9.89   Sodium 211 - 941 mmol/L 140   Potassium 3.5 - 5.1 mmol/L 3.4   Chloride 98 - 111 mmol/L 104   CO2 22 - 32 mmol/L 27   Calcium 8.9 - 10.3 mg/dL 9.1   Total Protein 6.5 - 8.1 g/dL 7.6   Total Bilirubin 0.3 - 1.2  mg/dL 0.5   Alkaline Phos 38 - 126 U/L 55   AST 15 - 41 U/L 20   ALT 0 - 44 U/L 19     No results found for: "INR", "PROTIME"  Imaging: US Abdomen Limited RUQ (LIVER/GB)  Result Date: 02/14/2022 CLINICAL DATA:  Right upper quadrant pain EXAM: ULTRASOUND ABDOMEN LIMITED RIGHT UPPER QUADRANT COMPARISON:  None Available. FINDINGS: Gallbladder: Gallbladder is physiologically distended containing exudative material. 1.8 cm gallstone in the gallbladder neck. Wall thickening up to 3.3 mm. Trace pericholecystic fluid. Sonographer reported sonographic Murphy's sign. Common bile duct: Diameter: 7 mm, unremarkable. No intrahepatic biliary ductal dilatation noted. Liver: No focal lesion identified. Within normal limits in parenchymal echogenicity. Portal vein is patent on color Doppler imaging with normal direction of blood flow towards the liver. Other: None. IMPRESSION: Cholelithiasis with mild gallbladder wall thickening, pericholecystic fluid, and reported positive sonographic Murphy's sign suggesting cholecystitis. Electronically Signed   By: Corlis Leak M.D.   On: 02/14/2022 13:02   DG Chest 2 View  Result Date: 02/14/2022 CLINICAL DATA:  Chest pain EXAM: CHEST - 2 VIEW COMPARISON:  Chest radiographs 06/04/2018 FINDINGS: The cardiomediastinal silhouette is normal. There is no focal consolidation or pulmonary edema. There is no pleural effusion or pneumothorax There is no acute osseous abnormality. Postsurgical changes of the left clavicle are unchanged. IMPRESSION: No radiographic evidence of acute cardiopulmonary process. Electronically Signed   By: Lesia Hausen M.D.   On: 02/14/2022 12:17     A/P: Acute on chronic cholecystitis.  Gallbladder noted to have a 1.8 cm gallstone in the gallbladder neck, distended with thick appearing fluid.  Labs/vital are reassuring but exquisitely tender in the right upper quadrant. I recommend proceeding with laparoscopic cholecystectomy with possible cholangiogram.  Discussed surgical technique and risks of surgery including bleeding, pain, scarring, intraabdominal injury specifically to the common bile duct and sequelae, bile leak, conversion to open surgery, subtotal cholecystectomy, blood clot, pneumonia, heart attack, stroke, failure to resolve symptoms, etc. Questions welcomed and answered.  Admit to surgical floor, n.p.o. after midnight, IV fluid resuscitation, pain and nausea control.  Plan OR tomorrow with Dr. Bedelia Person.    Patient Active Problem List   Diagnosis Date Noted   Acute cholecystitis 02/14/2022       Phylliss Blakes, MD [facility]  See AMION to contact appropriate on-call provider

## 2022-02-14 NOTE — Discharge Instructions (Addendum)
-  Head to the ED for further evaluation of your abdominal pain. You may have a gallbladder issue, which is a medical emergency.

## 2022-02-14 NOTE — ED Provider Notes (Signed)
[facility]    CSN: [id number] Arrival date & time: 02/14/22  1009      History   Chief Complaint Chief Complaint  Patient presents with   Chest Pain    HPI Todd Ochoa is a 56 y.o. male presenting with right upper quadrant pain radiating to the back for 2 weeks, worse over the last day.  Patient is [place] speaking, family member here today to provide translation at patient request.  The patient has never had any abdominal procedures.  He has not identified any aggravating factors or relieving factors.  Position and movement does not make the pain worse.  Denies change in bowel or bladder function.  HPI  Past Medical History:  Diagnosis Date   Hypertension     There are no problems to display for this patient.   Past Surgical History:  Procedure Laterality Date   ANKLE SURGERY Left    SHOULDER SURGERY         Home Medications    Prior to Admission medications   Medication Sig Start Date End Date Taking? Authorizing Provider  ibuprofen (ADVIL,MOTRIN) 800 MG tablet Take 1 tablet (800 mg total) by mouth every 8 (eight) hours as needed. 06/04/18   Charlestine Night, PA-C    Family History History reviewed. No pertinent family history.  Social History Social History   Tobacco Use   Smoking status: Never   Smokeless tobacco: Never  Vaping Use   Vaping Use: Never used  Substance Use Topics   Alcohol use: No   Drug use: Never     Allergies   Patient has no known allergies.   Review of Systems Review of Systems  Gastrointestinal:  Positive for abdominal pain.  All other systems reviewed and are negative.    Physical Exam Triage Vital Signs ED Triage Vitals  Enc Vitals Group     BP 02/14/22 1047 135/72     Pulse Rate 02/14/22 1047 70     Resp 02/14/22 1047 18     Temp 02/14/22 1047 98.4 F (36.9 C)     Temp src --      SpO2 02/14/22 1047 99 %     Weight --      Height --      Head Circumference --      Peak Flow --       Pain Score 02/14/22 1045 10     Pain Loc --      Pain Edu? --      Excl. in GC? --    No data found.  Updated Vital Signs BP 135/72   Pulse 70   Temp 98.4 F (36.9 C)   Resp 18   SpO2 99%   Visual Acuity Right Eye Distance:   Left Eye Distance:   Bilateral Distance:    Right Eye Near:   Left Eye Near:    Bilateral Near:     Physical Exam Vitals reviewed.  Constitutional:      General: He is not in acute distress.    Appearance: Normal appearance. He is not ill-appearing.     Comments: Patient lying on bed, appears uncomfortable. In no distress. Ambulates without assistance.   HENT:     Head: Normocephalic and atraumatic.     Mouth/Throat:     Mouth: Mucous membranes are moist.     Comments: Moist mucous membranes Eyes:     Extraocular Movements: Extraocular movements intact.     Pupils: Pupils are equal, round, and  reactive to light.  Cardiovascular:     Rate and Rhythm: Normal rate and regular rhythm.     Heart sounds: Normal heart sounds.  Pulmonary:     Effort: Pulmonary effort is normal.     Breath sounds: Normal breath sounds. No wheezing, rhonchi or rales.  Abdominal:     General: Bowel sounds are normal. There is no distension.     Palpations: Abdomen is soft. There is no mass.     Tenderness: There is abdominal tenderness in the right upper quadrant. There is no right CVA tenderness, left CVA tenderness, guarding or rebound.     Comments: Exquisitely TTP in the RUQ with guarding  Skin:    General: Skin is warm.     Capillary Refill: Capillary refill takes less than 2 seconds.     Comments: Good skin turgor  Neurological:     General: No focal deficit present.     Mental Status: He is alert and oriented to person, place, and time.  Psychiatric:        Mood and Affect: Mood normal.        Behavior: Behavior normal.      UC Treatments / Results  Labs (all labs ordered are listed, but only abnormal results are displayed) Labs Reviewed - No data to  display  EKG   Radiology No results found.  Procedures Procedures (including critical care time)  Medications Ordered in UC Medications - No data to display  Initial Impression / Assessment and Plan / UC Course  I have reviewed the triage vital signs and the nursing notes.  Pertinent labs & imaging results that were available during my care of the patient were reviewed by me and considered in my medical decision making (see chart for details).     This patient is a very pleasant 56 y.o. year old male presenting with RUQ pain. Afebrile, nontachy. He is exquisitely TTP in the RUQ. Differential is likely cholecystitis. We did check an EKG which was NSR, no prior EKG for comparison. Sent to ED via POV, patient and family member are in agreement.   Level 5 as he was admitted for cholecystectomy.   Final Clinical Impressions(s) / UC Diagnoses   Final diagnoses:  RUQ pain     Discharge Instructions      -Head to the ED for further evaluation of your abdominal pain. You may have a gallbladder issue, which is a medical emergency.   ED Prescriptions   None    PDMP not reviewed this encounter.   Rhys Martini, PA-C 02/14/22 1210    Rhys Martini, PA-C 02/15/22 1323

## 2022-02-14 NOTE — ED Triage Notes (Signed)
Pt reports CP that radiates to back for 2 weeks . Pain worse today.Todd Ochoa

## 2022-02-15 ENCOUNTER — Encounter (HOSPITAL_COMMUNITY): Admission: EM | Disposition: A | Discharge status: Home / Self Care | Payer: Self-pay | Source: Home / Self Care

## 2022-02-15 ENCOUNTER — Other Ambulatory Visit: Payer: Self-pay

## 2022-02-15 ENCOUNTER — Inpatient Hospital Stay (HOSPITAL_COMMUNITY): Payer: Medicare Other | Admitting: Anesthesiology

## 2022-02-15 ENCOUNTER — Encounter (HOSPITAL_COMMUNITY): Payer: Self-pay

## 2022-02-15 DIAGNOSIS — K8000 Calculus of gallbladder with acute cholecystitis without obstruction: Secondary | ICD-10-CM | POA: Diagnosis not present

## 2022-02-15 HISTORY — PX: CHOLECYSTECTOMY: SHX55

## 2022-02-15 LAB — COMPREHENSIVE METABOLIC PANEL
ALT: 56 U/L — ABNORMAL HIGH (ref 0–44)
AST: 75 U/L — ABNORMAL HIGH (ref 15–41)
Albumin: 3.4 g/dL — ABNORMAL LOW (ref 3.5–5.0)
Alkaline Phosphatase: 77 U/L (ref 38–126)
Anion gap: 10 (ref 5–15)
BUN: 8 mg/dL (ref 6–20)
CO2: 26 mmol/L (ref 22–32)
Calcium: 8.5 mg/dL — ABNORMAL LOW (ref 8.9–10.3)
Chloride: 102 mmol/L (ref 98–111)
Creatinine, Ser: 0.77 mg/dL (ref 0.61–1.24)
GFR, Estimated: >60 mL/min (ref >60)
Glucose, Bld: 130 mg/dL — ABNORMAL HIGH (ref 70–99)
Potassium: 3.5 mmol/L (ref 3.5–5.1)
Sodium: 138 mmol/L (ref 135–145)
Total Bilirubin: 0.8 mg/dL (ref 0.3–1.2)
Total Protein: 6.5 g/dL (ref 6.5–8.1)

## 2022-02-15 LAB — CBC
HCT: 45.6 % (ref 39.0–52.0)
Hemoglobin: 15.2 g/dL (ref 13.0–17.0)
MCH: 28.7 pg (ref 26.0–34.0)
MCHC: 33.3 g/dL (ref 30.0–36.0)
MCV: 86.2 fL (ref 80.0–100.0)
Platelets: 174 10*3/uL (ref 150–400)
RBC: 5.29 MIL/uL (ref 4.22–5.81)
RDW: 12.5 % (ref 11.5–15.5)
WBC: 9.1 10*3/uL (ref 4.0–10.5)
nRBC: 0 % (ref 0.0–0.2)

## 2022-02-15 LAB — MAGNESIUM: Magnesium: 2 mg/dL (ref 1.7–2.4)

## 2022-02-15 LAB — SURGICAL PCR SCREEN
MRSA, PCR: NEGATIVE
Staphylococcus aureus: NEGATIVE

## 2022-02-15 SURGERY — LAPAROSCOPIC CHOLECYSTECTOMY
Anesthesia: General | Site: Abdomen

## 2022-02-15 MED ORDER — DEXAMETHASONE SODIUM PHOSPHATE 10 MG/ML IJ SOLN
INTRAMUSCULAR | Status: DC | PRN
  Administered 2022-02-15: 10 mg via INTRAVENOUS

## 2022-02-15 MED ORDER — PROPOFOL 10 MG/ML IV BOLUS
INTRAVENOUS | Status: AC
  Filled 2022-02-15: qty 20

## 2022-02-15 MED ORDER — ORAL CARE MOUTH RINSE: 15.0000 mL | Freq: Once | OROMUCOSAL | Status: AC

## 2022-02-15 MED ORDER — FENTANYL CITRATE (PF) 250 MCG/5ML IJ SOLN
INTRAMUSCULAR | Status: DC | PRN
  Administered 2022-02-15: 150 ug via INTRAVENOUS
  Administered 2022-02-15: 25 ug via INTRAVENOUS

## 2022-02-15 MED ORDER — 0.9 % SODIUM CHLORIDE (POUR BTL) OPTIME
TOPICAL | Status: DC | PRN
  Administered 2022-02-15: 1000 mL

## 2022-02-15 MED ORDER — FENTANYL CITRATE (PF) 100 MCG/2ML IJ SOLN: 25.0000 ug | INTRAMUSCULAR | Status: DC | PRN

## 2022-02-15 MED ORDER — ACETAMINOPHEN 10 MG/ML IV SOLN
INTRAVENOUS | Status: DC | PRN
  Administered 2022-02-15: 1000 mg via INTRAVENOUS

## 2022-02-15 MED ORDER — SODIUM CHLORIDE 0.9 % IR SOLN
Status: DC | PRN
  Administered 2022-02-15: 1000 mL

## 2022-02-15 MED ORDER — ONDANSETRON HCL 4 MG/2ML IJ SOLN
INTRAMUSCULAR | Status: AC
  Filled 2022-02-15: qty 2

## 2022-02-15 MED ORDER — SUGAMMADEX SODIUM 200 MG/2ML IV SOLN
INTRAVENOUS | Status: DC | PRN
  Administered 2022-02-15: 200 mg via INTRAVENOUS

## 2022-02-15 MED ORDER — DEXAMETHASONE SODIUM PHOSPHATE 10 MG/ML IJ SOLN
INTRAMUSCULAR | Status: AC
  Filled 2022-02-15: qty 1

## 2022-02-15 MED ORDER — MIDAZOLAM HCL 2 MG/2ML IJ SOLN
INTRAMUSCULAR | Status: AC
  Filled 2022-02-15: qty 2

## 2022-02-15 MED ORDER — KETOROLAC TROMETHAMINE 30 MG/ML IJ SOLN: 30.0000 mg | Freq: Once | INTRAMUSCULAR | Status: DC

## 2022-02-15 MED ORDER — MIDAZOLAM HCL 2 MG/2ML IJ SOLN
INTRAMUSCULAR | Status: DC | PRN
  Administered 2022-02-15: 2 mg via INTRAVENOUS

## 2022-02-15 MED ORDER — ACETAMINOPHEN 10 MG/ML IV SOLN: 1000.0000 mg | Freq: Once | INTRAVENOUS | Status: DC | PRN

## 2022-02-15 MED ORDER — LACTATED RINGERS IV SOLN: INTRAVENOUS | Status: DC

## 2022-02-15 MED ORDER — PROPOFOL 10 MG/ML IV BOLUS
INTRAVENOUS | Status: DC | PRN
  Administered 2022-02-15: 20 mg via INTRAVENOUS
  Administered 2022-02-15: 100 mg via INTRAVENOUS
  Administered 2022-02-15: 40 mg via INTRAVENOUS

## 2022-02-15 MED ORDER — FENTANYL CITRATE (PF) 250 MCG/5ML IJ SOLN
INTRAMUSCULAR | Status: AC
  Filled 2022-02-15: qty 5

## 2022-02-15 MED ORDER — AMISULPRIDE (ANTIEMETIC) 5 MG/2ML IV SOLN: 10.0000 mg | Freq: Once | INTRAVENOUS | Status: DC | PRN

## 2022-02-15 MED ORDER — ACETAMINOPHEN 10 MG/ML IV SOLN
INTRAVENOUS | Status: AC
  Filled 2022-02-15: qty 100

## 2022-02-15 MED ORDER — LIDOCAINE 2% (20 MG/ML) 5 ML SYRINGE
INTRAMUSCULAR | Status: DC | PRN
  Administered 2022-02-15: 60 mg via INTRAVENOUS

## 2022-02-15 MED ORDER — PROMETHAZINE HCL 25 MG/ML IJ SOLN: 6.2500 mg | INTRAMUSCULAR | Status: DC | PRN

## 2022-02-15 MED ORDER — ONDANSETRON HCL 4 MG/2ML IJ SOLN
INTRAMUSCULAR | Status: DC | PRN
  Administered 2022-02-15: 4 mg via INTRAVENOUS

## 2022-02-15 MED ORDER — PHENYLEPHRINE 80 MCG/ML (10ML) SYRINGE FOR IV PUSH (FOR BLOOD PRESSURE SUPPORT)
PREFILLED_SYRINGE | INTRAVENOUS | Status: DC | PRN
  Administered 2022-02-15 (×3): 160 ug via INTRAVENOUS

## 2022-02-15 MED ORDER — ROCURONIUM BROMIDE 10 MG/ML (PF) SYRINGE
PREFILLED_SYRINGE | INTRAVENOUS | Status: DC | PRN
  Administered 2022-02-15: 30 mg via INTRAVENOUS
  Administered 2022-02-15: 50 mg via INTRAVENOUS

## 2022-02-15 MED ORDER — LIDOCAINE 2% (20 MG/ML) 5 ML SYRINGE
INTRAMUSCULAR | Status: AC
  Filled 2022-02-15: qty 5

## 2022-02-15 MED ORDER — OXYCODONE HCL 5 MG PO TABS: 5.0000 mg | ORAL_TABLET | Freq: Once | ORAL | Status: DC | PRN

## 2022-02-15 MED ORDER — OXYCODONE HCL 5 MG/5ML PO SOLN: 5.0000 mg | Freq: Once | ORAL | Status: DC | PRN

## 2022-02-15 MED ORDER — CHLORHEXIDINE GLUCONATE 0.12 % MT SOLN
15.0000 mL | Freq: Once | OROMUCOSAL | Status: AC
  Administered 2022-02-15: 15 mL via OROMUCOSAL

## 2022-02-15 MED ORDER — BUPIVACAINE-EPINEPHRINE (PF) 0.25% -1:200000 IJ SOLN
INTRAMUSCULAR | Status: AC
  Filled 2022-02-15: qty 30

## 2022-02-15 MED ORDER — CHLORHEXIDINE GLUCONATE 0.12 % MT SOLN
OROMUCOSAL | Status: AC
  Filled 2022-02-15: qty 15

## 2022-02-15 MED ORDER — BUPIVACAINE-EPINEPHRINE (PF) 0.25% -1:200000 IJ SOLN
INTRAMUSCULAR | Status: DC | PRN
  Administered 2022-02-15: 30 mL

## 2022-02-15 SURGICAL SUPPLY — 41 items
APPLIER CLIP 5 13 M/L LIGAMAX5 (MISCELLANEOUS) ×2
BIOPATCH RED 1 DISK 7.0 (GAUZE/BANDAGES/DRESSINGS) ×1 IMPLANT
BLADE CLIPPER SURG (BLADE) IMPLANT
CANISTER SUCT 3000ML PPV (MISCELLANEOUS) ×2 IMPLANT
CHLORAPREP W/TINT 26 (MISCELLANEOUS) ×2 IMPLANT
CLIP APPLIE 5 13 M/L LIGAMAX5 (MISCELLANEOUS) ×1 IMPLANT
COVER SURGICAL LIGHT HANDLE (MISCELLANEOUS) ×2 IMPLANT
DERMABOND ADVANCED (GAUZE/BANDAGES/DRESSINGS) ×1
DERMABOND ADVANCED .7 DNX12 (GAUZE/BANDAGES/DRESSINGS) ×1 IMPLANT
DISSECTOR BLUNT TIP ENDO 5MM (MISCELLANEOUS) ×1 IMPLANT
DRAIN CHANNEL 19F RND (DRAIN) ×1 IMPLANT
ELECT CAUTERY BLADE 6.4 (BLADE) ×2 IMPLANT
ELECT REM PT RETURN 9FT ADLT (ELECTROSURGICAL) ×2
ELECTRODE REM PT RTRN 9FT ADLT (ELECTROSURGICAL) ×1 IMPLANT
EVACUATOR SILICONE 100CC (DRAIN) ×1 IMPLANT
GLOVE BIO SURGEON STRL SZ 6.5 (GLOVE) ×2 IMPLANT
GLOVE BIOGEL PI IND STRL 6 (GLOVE) ×1 IMPLANT
GLOVE BIOGEL PI INDICATOR 6 (GLOVE) ×1
GOWN STRL REUS W/ TWL LRG LVL3 (GOWN DISPOSABLE) ×3 IMPLANT
GOWN STRL REUS W/TWL LRG LVL3 (GOWN DISPOSABLE) ×3
KIT BASIN OR (CUSTOM PROCEDURE TRAY) ×2 IMPLANT
KIT TURNOVER KIT B (KITS) ×2 IMPLANT
NS IRRIG 1000ML POUR BTL (IV SOLUTION) ×2 IMPLANT
PAD ARMBOARD 7.5X6 YLW CONV (MISCELLANEOUS) ×2 IMPLANT
PENCIL BUTTON HOLSTER BLD 10FT (ELECTRODE) ×2 IMPLANT
POUCH RETRIEVAL ECOSAC 10 (ENDOMECHANICALS) ×1 IMPLANT
POUCH RETRIEVAL ECOSAC 10MM (ENDOMECHANICALS) ×1
SCISSORS LAP 5X35 DISP (ENDOMECHANICALS) ×2 IMPLANT
SET IRRIG TUBING LAPAROSCOPIC (IRRIGATION / IRRIGATOR) ×1 IMPLANT
SET TUBE SMOKE EVAC HIGH FLOW (TUBING) ×2 IMPLANT
SLEEVE ENDOPATH XCEL 5M (ENDOMECHANICALS) ×4 IMPLANT
SPONGE DRAIN TRACH 4X4 STRL 2S (GAUZE/BANDAGES/DRESSINGS) ×1 IMPLANT
SUT ETHILON 2 0 FS 18 (SUTURE) ×1 IMPLANT
SUT MNCRL AB 4-0 PS2 18 (SUTURE) ×2 IMPLANT
SUT VIC AB 0 UR5 27 (SUTURE) IMPLANT
SUT VICRYL 0 AB UR-6 (SUTURE) IMPLANT
TOWEL GREEN STERILE FF (TOWEL DISPOSABLE) ×2 IMPLANT
TRAY LAPAROSCOPIC MC (CUSTOM PROCEDURE TRAY) ×2 IMPLANT
TROCAR XCEL BLUNT TIP 100MML (ENDOMECHANICALS) ×2 IMPLANT
TROCAR XCEL NON-BLD 5MMX100MML (ENDOMECHANICALS) ×2 IMPLANT
WATER STERILE IRR 1000ML POUR (IV SOLUTION) ×2 IMPLANT

## 2022-02-15 NOTE — Progress Notes (Signed)
General Surgery Follow Up Note  Subjective:    Overnight Issues:   Objective:  Vital signs for last 24 hours: Temp:  [97.8 F (36.6 C)-99 F (37.2 C)] 98.2 F (36.8 C) (07/01 1015) Pulse Rate:  [66-85] 85 (07/01 1015) Resp:  [15-20] 18 (07/01 1015) BP: (117-158)/(72-99) 117/84 (07/01 1015) SpO2:  [93 %-100 %] 93 % (07/01 1015) Weight:  [72.6 kg] 72.6 kg (06/30 1153)  Hemodynamic parameters for last 24 hours:    Intake/Output from previous day: 06/30 0701 - 07/01 0700 In: 1096.8 [P.O.:240; I.V.:840.2; IV Piggyback:16.6] Out: -   Intake/Output this shift: No intake/output data recorded.  Vent settings for last 24 hours:    Physical Exam:  Gen: comfortable, no distress Neuro: non-focal exam HEENT: PERRL Neck: supple CV: RRR Pulm: unlabored breathing Abd: soft, RUQ TTP GU: clear yellow urine Extr: wwp, no edema   Results for orders placed or performed during the hospital encounter of 02/14/22 (from the past 24 hour(s))  Lipase, blood     Status: None   Collection Time: 02/14/22 12:03 PM  Result Value Ref Range   Lipase 32 11 - 51 U/L  Comprehensive metabolic panel     Status: Abnormal   Collection Time: 02/14/22 12:03 PM  Result Value Ref Range   Sodium 140 135 - 145 mmol/L   Potassium 3.4 (L) 3.5 - 5.1 mmol/L   Chloride 104 98 - 111 mmol/L   CO2 27 22 - 32 mmol/L   Glucose, Bld 129 (H) 70 - 99 mg/dL   BUN 8 6 - 20 mg/dL   Creatinine, Ser 1.01 0.61 - 1.24 mg/dL   Calcium 9.1 8.9 - 75.1 mg/dL   Total Protein 7.6 6.5 - 8.1 g/dL   Albumin 4.1 3.5 - 5.0 g/dL   AST 20 15 - 41 U/L   ALT 19 0 - 44 U/L   Alkaline Phosphatase 55 38 - 126 U/L   Total Bilirubin 0.5 0.3 - 1.2 mg/dL   GFR, Estimated >02 >58 mL/min   Anion gap 9 5 - 15  CBC     Status: Abnormal   Collection Time: 02/14/22 12:03 PM  Result Value Ref Range   WBC 10.9 (H) 4.0 - 10.5 K/uL   RBC 5.61 4.22 - 5.81 MIL/uL   Hemoglobin 16.3 13.0 - 17.0 g/dL   HCT 52.7 78.2 - 42.3 %   MCV 87.2 80.0 -  100.0 fL   MCH 29.1 26.0 - 34.0 pg   MCHC 33.3 30.0 - 36.0 g/dL   RDW 53.6 14.4 - 31.5 %   Platelets 208 150 - 400 K/uL   nRBC 0.0 0.0 - 0.2 %  Troponin I (High Sensitivity)     Status: None   Collection Time: 02/14/22 12:03 PM  Result Value Ref Range   Troponin I (High Sensitivity) 10 <18 ng/L  Urinalysis, Routine w reflex microscopic     Status: Abnormal   Collection Time: 02/14/22 12:41 PM  Result Value Ref Range   Color, Urine YELLOW YELLOW   APPearance CLEAR CLEAR   Specific Gravity, Urine 1.030 1.005 - 1.030   pH 7.0 5.0 - 8.0   Glucose, UA 50 (A) NEGATIVE mg/dL   Hgb urine dipstick NEGATIVE NEGATIVE   Bilirubin Urine NEGATIVE NEGATIVE   Ketones, ur 5 (A) NEGATIVE mg/dL   Protein, ur 30 (A) NEGATIVE mg/dL   Nitrite NEGATIVE NEGATIVE   Leukocytes,Ua NEGATIVE NEGATIVE   RBC / HPF 0-5 0 - 5 RBC/hpf   WBC, UA  0-5 0 - 5 WBC/hpf   Bacteria, UA NONE SEEN NONE SEEN   Mucus PRESENT   Troponin I (High Sensitivity)     Status: None   Collection Time: 02/14/22  2:28 PM  Result Value Ref Range   Troponin I (High Sensitivity) 7 <18 ng/L  Surgical PCR screen     Status: None   Collection Time: 02/15/22  2:30 AM   Specimen: Nasal Mucosa; Nasal Swab  Result Value Ref Range   MRSA, PCR NEGATIVE NEGATIVE   Staphylococcus aureus NEGATIVE NEGATIVE  Comprehensive metabolic panel     Status: Abnormal   Collection Time: 02/15/22  2:31 AM  Result Value Ref Range   Sodium 138 135 - 145 mmol/L   Potassium 3.5 3.5 - 5.1 mmol/L   Chloride 102 98 - 111 mmol/L   CO2 26 22 - 32 mmol/L   Glucose, Bld 130 (H) 70 - 99 mg/dL   BUN 8 6 - 20 mg/dL   Creatinine, Ser 1.82 0.61 - 1.24 mg/dL   Calcium 8.5 (L) 8.9 - 10.3 mg/dL   Total Protein 6.5 6.5 - 8.1 g/dL   Albumin 3.4 (L) 3.5 - 5.0 g/dL   AST 75 (H) 15 - 41 U/L   ALT 56 (H) 0 - 44 U/L   Alkaline Phosphatase 77 38 - 126 U/L   Total Bilirubin 0.8 0.3 - 1.2 mg/dL   GFR, Estimated >99 >37 mL/min   Anion gap 10 5 - 15  Magnesium     Status:  None   Collection Time: 02/15/22  2:31 AM  Result Value Ref Range   Magnesium 2.0 1.7 - 2.4 mg/dL  CBC     Status: None   Collection Time: 02/15/22  2:31 AM  Result Value Ref Range   WBC 9.1 4.0 - 10.5 K/uL   RBC 5.29 4.22 - 5.81 MIL/uL   Hemoglobin 15.2 13.0 - 17.0 g/dL   HCT 16.9 67.8 - 93.8 %   MCV 86.2 80.0 - 100.0 fL   MCH 28.7 26.0 - 34.0 pg   MCHC 33.3 30.0 - 36.0 g/dL   RDW 10.1 75.1 - 02.5 %   Platelets 174 150 - 400 K/uL   nRBC 0.0 0.0 - 0.2 %    Assessment & Plan: Present on Admission:  Acute cholecystitis    LOS: 1 day   Additional comments:I reviewed the patient's new clinical lab test results.   and I reviewed the patients new imaging test results.    Acute cholecystitis - plan for lap chole today. Informed consent was obtained after detailed explanation of risks, including bleeding, infection, biloma, hematoma, injury to common bile duct, need for IOC to delineate anatomy, and need for conversion to open procedure. All questions answered to the patient's satisfaction. Informed consent obtained using [facility] phone interpreter Eliseo Squires [phone] FEN - NPO for surgery DVT - SCDs Dispo - possibly home post-op     Todd Monks, MD Trauma & General Surgery Please use [email or web address] to contact on call provider  02/15/2022  *Care during the described time interval was provided by me. I have reviewed this patient's available data, including medical history, events of note, physical examination and test results as part of my evaluation.

## 2022-02-15 NOTE — Progress Notes (Signed)
Abx x24h post-op, home with drain 7/2  Diamantina Monks, MD General and Trauma Surgery [facility] Surgery

## 2022-02-15 NOTE — Plan of Care (Signed)
  Problem: Pain Managment: Goal: General experience of comfort will improve Outcome: Progressing   Problem: Safety: Goal: Ability to remain free from injury will improve Outcome: Progressing   

## 2022-02-15 NOTE — Anesthesia Preprocedure Evaluation (Addendum)
Anesthesia Evaluation  Patient identified by MRN, date of birth, ID band Patient awake    Reviewed: Allergy & Precautions, NPO status , Patient's Chart, lab work & pertinent test results  Airway Mallampati: III  TM Distance: >3 FB Neck ROM: Full    Dental no notable dental hx.    Pulmonary neg pulmonary ROS,    Pulmonary exam normal        Cardiovascular hypertension, Normal cardiovascular exam     Neuro/Psych negative neurological ROS  negative psych ROS   GI/Hepatic negative GI ROS, Neg liver ROS,   Endo/Other  negative endocrine ROS  Renal/GU negative Renal ROS     Musculoskeletal negative musculoskeletal ROS (+)   Abdominal   Peds  Hematology negative hematology ROS (+)   Anesthesia Other Findings CHOLECYSTITIS  Reproductive/Obstetrics                            Anesthesia Physical Anesthesia Plan  ASA: 2  Anesthesia Plan: General   Post-op Pain Management:    Induction: Intravenous  PONV Risk Score and Plan: 3 and Ondansetron, Dexamethasone, Midazolam and Treatment may vary due to age or medical condition  Airway Management Planned: Oral ETT  Additional Equipment:   Intra-op Plan:   Post-operative Plan: Extubation in OR  Informed Consent: I have reviewed the patients History and Physical, chart, labs and discussed the procedure including the risks, benefits and alternatives for the proposed anesthesia with the patient or authorized representative who has indicated his/her understanding and acceptance.     Dental advisory given and Interpreter used for interveiw  Plan Discussed with: CRNA  Anesthesia Plan Comments:        Anesthesia Quick Evaluation

## 2022-02-15 NOTE — Op Note (Signed)
   Operative Note  Date: 02/15/2022  Procedure: laparoscopic cholecystectomy  Pre-op diagnosis: acute cholecystitis Post-op diagnosis:  Acute calculous cholecystitis  Indication and clinical history: The patient is a 56 y.o. year old male with acute cholecystitis  Surgeon: Diamantina Monks, MD  Anesthesiologist: Bradley Ferris, MD Anesthesia: General  Findings:  Specimen: gallbladder EBL: <5cc Drains/Implants: 43F JP, gallbladder fossa, exiting RUQ  Disposition: PACU - hemodynamically stable.  Description of procedure: The patient was positioned supine on the operating room table. Time-out was performed verifying correct patient, procedure, signature of informed consent, and administration of pre-operative antibiotics. General anesthetic induction and intubation were uneventful. The abdomen was prepped and draped in the usual sterile fashion. An infra-umbilical incision was made using an open technique using zero vicryl stay sutures on either side of the fascia and a 49mm Hassan port inserted. After establishing pneumoperitoneum, which the patient tolerated well, the abdominal cavity was inspected and no injury of any intra-abdominal structures was identified. Additional ports were placed under direct visualization and using local anesthetic: two 15mm ports in the right subcostal region and a 35mm port in the epigastric region. The patient was re-positioned to reverse Trendelenburg and right side up. Adhesiolysis was performed to expose the gallbladder due to overlying omentum. The gallbladder was too tense to grasp, so was aspirated before retracting cephalad. The infundibulum was identified and retracted toward the right lower quadrant. The peritoneum was incised over the infundibulum and the triangle of Calot dissected to expose the critical view of safety. With clear identification and isolation of the cystic duct and cystic artery, the cystic artery was doubly clipped and divided. After this, the  cystic duct was identified as a single structure entering the gallbladder, and was also doubly clipped and divided. The gallbladder was dissected off the liver bed using electrocautery and hemostasis of the liver bed was confirmed prior to separation of the final peritoneal attachments of the gallbladder to the liver bed. The gallbladder fossa was irrigated and fluid returned clear. After transection of the final peritoneal attachments, the gallbladder was placed in an endoscopic specimen retrieval bag, removed via the umbilical port site, and sent to pathology as a permanent specimen. The gallbladder fossa was inspected confirming hemostasis, the absence of bile leakage from the cystic duct stump, and correct placement of clips on the cystic artery and cystic duct stumps. The abdomen was desufflated and the fascia of the umbilical port site was closed using the previously placed stay sutures. Additional local anesthetic was administered at the umbilical port site.  The skin of all incisions was closed with 4-0 monocryl. Sterile dressings were applied. All sponge and instrument counts were correct at the conclusion of the procedure. The patient was awakened from anesthesia, extubated uneventfully, and transported to the PACU - hemodynamically stable.. There were no complications.   Upon entering the abdomen (organ space), I encountered infection of the gallbladder .  CASE DATA:  Type of patient?: DOW CASE (Surgical Hospitalist [facility])  Status of Case? URGENT Add On  Infection Present At Time Of Surgery (PATOS)?  INFECTION of the gallbladder    Diamantina Monks, MD General and Trauma Surgery [facility] Surgery

## 2022-02-15 NOTE — Anesthesia Procedure Notes (Signed)
Procedure Name: Intubation Date/Time: 02/15/2022 12:17 PM  Performed by: Dorthea Cove, CRNAPre-anesthesia Checklist: Patient identified, Emergency Drugs available, Suction available and Patient being monitored Patient Re-evaluated:Patient Re-evaluated prior to induction Oxygen Delivery Method: Circle system utilized Preoxygenation: Pre-oxygenation with 100% oxygen Induction Type: IV induction Ventilation: Mask ventilation without difficulty Laryngoscope Size: Mac and 4 Grade View: Grade I Tube type: Oral Tube size: 7.5 mm Number of attempts: 1 Airway Equipment and Method: Stylet and Oral airway Placement Confirmation: ETT inserted through vocal cords under direct vision, positive ETCO2 and breath sounds checked- equal and bilateral Secured at: 22 cm Tube secured with: Tape Dental Injury: Teeth and Oropharynx as per pre-operative assessment

## 2022-02-15 NOTE — Anesthesia Postprocedure Evaluation (Signed)
Anesthesia Post Note  Patient: Todd Ochoa  Procedure(s) Performed: LAPAROSCOPIC CHOLECYSTECTOMY (Abdomen)     Patient location during evaluation: PACU Anesthesia Type: General Level of consciousness: awake Pain management: pain level controlled Vital Signs Assessment: post-procedure vital signs reviewed and stable Respiratory status: spontaneous breathing, nonlabored ventilation, respiratory function stable and patient connected to nasal cannula oxygen Cardiovascular status: blood pressure returned to baseline and stable Postop Assessment: no apparent nausea or vomiting Anesthetic complications: no   No notable events documented.  Last Vitals:  Vitals:   02/15/22 1415 02/15/22 1438  BP: 100/66 111/67  Pulse: 70 79  Resp: 15 17  Temp: 36.7 C 36.9 C  SpO2: 94% 96%    Last Pain:  Vitals:   02/15/22 1438  TempSrc: Oral  PainSc: 0-No pain                 [name] P Kacyn Souder

## 2022-02-15 NOTE — Transfer of Care (Signed)
Immediate Anesthesia Transfer of Care Note  Patient: Todd Ochoa  Procedure(s) Performed: LAPAROSCOPIC CHOLECYSTECTOMY (Abdomen)  Patient Location: PACU  Anesthesia Type:General  Level of Consciousness: awake and drowsy  Airway & Oxygen Therapy: Patient Spontanous Breathing and Patient connected to nasal cannula oxygen  Post-op Assessment: Report given to RN and Post -op Vital signs reviewed and stable  Post vital signs: Reviewed and stable  Last Vitals: SEE PACU vitals Vitals Value Taken Time  BP    Temp    Pulse 62   Resp 15   SpO2 91     Last Pain:  Vitals:   02/15/22 1015  TempSrc: Oral  PainSc:          Complications: No notable events documented.

## 2022-02-16 ENCOUNTER — Encounter (HOSPITAL_COMMUNITY): Payer: Self-pay | Admitting: Surgery

## 2022-02-16 NOTE — Progress Notes (Signed)
1 Day Post-Op   Subjective/Chief Complaint: Patient comfortable Family at bedside   Objective: Vital signs in last 24 hours: Temp:  [97.9 F (36.6 C)-98.8 F (37.1 C)] 98 F (36.7 C) (07/02 0828) Pulse Rate:  [65-85] 74 (07/02 0828) Resp:  [15-18] 17 (07/02 0828) BP: (99-122)/(61-84) 105/82 (07/02 0828) SpO2:  [93 %-97 %] 95 % (07/02 0828) Weight:  [72.6 kg] 72.6 kg (07/01 1144) Last BM Date : 02/13/22  Intake/Output from previous day: 07/01 0701 - 07/02 0700 In: 750 [I.V.:700; IV Piggyback:50] Out: 135 [Drains:110; Blood:25] Intake/Output this shift: No intake/output data recorded.  General appearance: alert and cooperative Resp: clear to auscultation bilaterally Cardio: NSR Incision/Wound: Port sites clean dry intact.  JP drain is serosanguineous  Lab Results:  Recent Labs    02/14/22 1203 02/15/22 0231  WBC 10.9* 9.1  HGB 16.3 15.2  HCT 48.9 45.6  PLT 208 174   BMET Recent Labs    02/14/22 1203 02/15/22 0231  NA 140 138  K 3.4* 3.5  CL 104 102  CO2 27 26  GLUCOSE 129* 130*  BUN 8 8  CREATININE 0.74 0.77  CALCIUM 9.1 8.5*   PT/INR No results for input(s): "LABPROT", "INR" in the last 72 hours. ABG No results for input(s): "PHART", "HCO3" in the last 72 hours.  Invalid input(s): "PCO2", "PO2"  Studies/Results: US Abdomen Limited RUQ (LIVER/GB)  Result Date: 02/14/2022 CLINICAL DATA:  Right upper quadrant pain EXAM: ULTRASOUND ABDOMEN LIMITED RIGHT UPPER QUADRANT COMPARISON:  None Available. FINDINGS: Gallbladder: Gallbladder is physiologically distended containing exudative material. 1.8 cm gallstone in the gallbladder neck. Wall thickening up to 3.3 mm. Trace pericholecystic fluid. Sonographer reported sonographic Murphy's sign. Common bile duct: Diameter: 7 mm, unremarkable. No intrahepatic biliary ductal dilatation noted. Liver: No focal lesion identified. Within normal limits in parenchymal echogenicity. Portal vein is patent on color Doppler  imaging with normal direction of blood flow towards the liver. Other: None. IMPRESSION: Cholelithiasis with mild gallbladder wall thickening, pericholecystic fluid, and reported positive sonographic Murphy's sign suggesting cholecystitis. Electronically Signed   By: Corlis Leak M.D.   On: 02/14/2022 13:02   DG Chest 2 View  Result Date: 02/14/2022 CLINICAL DATA:  Chest pain EXAM: CHEST - 2 VIEW COMPARISON:  Chest radiographs 06/04/2018 FINDINGS: The cardiomediastinal silhouette is normal. There is no focal consolidation or pulmonary edema. There is no pleural effusion or pneumothorax There is no acute osseous abnormality. Postsurgical changes of the left clavicle are unchanged. IMPRESSION: No radiographic evidence of acute cardiopulmonary process. Electronically Signed   By: Lesia Hausen M.D.   On: 02/14/2022 12:17    Anti-infectives: Anti-infectives (From admission, onward)    Start     Dose/Rate Route Frequency Ordered Stop   02/14/22 1900  piperacillin-tazobactam (ZOSYN) IVPB 3.375 g        3.375 g 12.5 mL/hr over 240 Minutes Intravenous Every 8 hours 02/14/22 1853 02/21/22 1859   02/14/22 1745  piperacillin-tazobactam (ZOSYN) IVPB 3.375 g        3.375 g 100 mL/hr over 30 Minutes Intravenous  Once 02/14/22 1730 02/14/22 1824       Assessment/Plan: s/p Procedure(s): LAPAROSCOPIC CHOLECYSTECTOMY (N/A) Advance diet  Wean oxygen  Ambulate  Hopefully home Monday  LOS: 2 days    Dortha Schwalbe MD  02/16/2022

## 2022-02-17 MED ORDER — TRAMADOL HCL 50 MG PO TABS: 50.0000 mg | ORAL_TABLET | Freq: Four times a day (QID) | ORAL | 0 refills | Status: AC | PRN

## 2022-02-17 MED ORDER — IBUPROFEN 800 MG PO TABS: 800.0000 mg | ORAL_TABLET | Freq: Three times a day (TID) | ORAL | 0 refills | Status: AC | PRN

## 2022-02-17 MED ORDER — ACETAMINOPHEN 500 MG PO TABS: 1000.0000 mg | ORAL_TABLET | Freq: Four times a day (QID) | ORAL | Status: AC | PRN

## 2022-02-17 MED ORDER — PANTOPRAZOLE SODIUM 40 MG PO TBEC: 40.0000 mg | DELAYED_RELEASE_TABLET | Freq: Every day | ORAL | Status: DC

## 2022-02-17 NOTE — Discharge Summary (Signed)
[facility] Discharge Summary   Patient ID: Todd Ochoa MRN: [id number] DOB/AGE: 04/29/1966 56 y.o.  Admit date: 02/14/2022 Discharge date: 02/17/2022  Admitting Diagnosis: Acute cholecystitis   Discharge Diagnosis Patient Active Problem List   Diagnosis Date Noted   Acute cholecystitis 02/14/2022  S/P laparoscopic cholecystectomy   Consultants None   Imaging: No results found.  Procedures Dr. Kris Mouton (02/15/22) - Laparoscopic Cholecystectomy   Hospital Course:  Patient is a 56 year old male who presented to the ED with abdominal pain.  Workup showed acute cholecystitis.  Patient was admitted and underwent procedure listed above.  Tolerated procedure well and was transferred to the floor.  Diet was advanced as tolerated.  On POD2, the patient was voiding well, tolerating diet, ambulating well, pain well controlled, vital signs stable, incisions c/d/i and felt stable for discharge home.  Patient will follow up in our office in 1 weeks and knows to call with questions or concerns.  He will call to confirm appointment date/time.    Physical Exam: General:  Alert, NAD, pleasant, comfortable Abd:  Soft, ND, mild tenderness, incisions C/D/I, drain with minimal sanguinous drainage    I or a member of my team have reviewed this patient in the Controlled Substance Database.   Allergies as of 02/17/2022   No Known Allergies      Medication List     TAKE these medications    acetaminophen 500 MG tablet Commonly known as: TYLENOL Take 2 tablets (1,000 mg total) by mouth every 6 (six) hours as needed for mild pain or fever.   ibuprofen 800 MG tablet Commonly known as: ADVIL Take 1 tablet (800 mg total) by mouth every 8 (eight) hours as needed for moderate pain. Take with food   traMADol 50 MG tablet Commonly known as: ULTRAM Take 1 tablet (50 mg total) by mouth every 6 (six) hours as needed for moderate pain.          Follow-up Information      [facility]. Go on 02/24/2022.   Specialty: General Surgery Why: Visit with nurse for drain removal scheduled for 2:00 PM. Please arrive 15 min prior to appointment time for check in. Contact information: [address] [place] [address] [phone]         [facility], Hedda [place], [place]. Go on 03/11/2022.   Specialty: General Surgery Why: 11:00 AM, please arrive 30 min prior to appointment time to check in. Have ID and any insurance information with you. Contact information: [address] [place] [address] [phone]                 Signed: Juliet Rude , [facility] 02/17/2022, 11:44 AM Please see Amion for pager number during day hours 7:00am-4:30pm

## 2022-02-17 NOTE — Plan of Care (Signed)
  Problem: Education: Goal: Knowledge of General Education information will improve Description: Including pain rating scale, medication(s)/side effects and non-pharmacologic comfort measures 02/17/2022 1212 by Karolee Ohs, RN Outcome: Adequate for Discharge 02/17/2022 0837 by Karolee Ohs, RN Outcome: Progressing   Problem: Health Behavior/Discharge Planning: Goal: Ability to manage health-related needs will improve 02/17/2022 1212 by Karolee Ohs, RN Outcome: Adequate for Discharge 02/17/2022 0837 by Karolee Ohs, RN Outcome: Progressing   Problem: Clinical Measurements: Goal: Ability to maintain clinical measurements within normal limits will improve 02/17/2022 1212 by Karolee Ohs, RN Outcome: Adequate for Discharge 02/17/2022 0837 by Karolee Ohs, RN Outcome: Progressing Goal: Will remain free from infection Outcome: Adequate for Discharge Goal: Diagnostic test results will improve Outcome: Adequate for Discharge Goal: Respiratory complications will improve Outcome: Adequate for Discharge Goal: Cardiovascular complication will be avoided Outcome: Adequate for Discharge   Problem: Activity: Goal: Risk for activity intolerance will decrease Outcome: Adequate for Discharge   Problem: Nutrition: Goal: Adequate nutrition will be maintained Outcome: Adequate for Discharge   Problem: Coping: Goal: Level of anxiety will decrease Outcome: Adequate for Discharge   Problem: Elimination: Goal: Will not experience complications related to bowel motility Outcome: Adequate for Discharge Goal: Will not experience complications related to urinary retention Outcome: Adequate for Discharge   Problem: Pain Managment: Goal: General experience of comfort will improve Outcome: Adequate for Discharge   Problem: Safety: Goal: Ability to remain free from injury will improve Outcome: Adequate for Discharge   Problem: Skin Integrity: Goal: Risk for impaired skin integrity will  decrease Outcome: Adequate for Discharge

## 2022-02-17 NOTE — Plan of Care (Signed)

## 2022-02-17 NOTE — Progress Notes (Signed)
N[facility] Discharge Note  Admit Date:  02/14/2022 Discharge date: 02/17/2022   Todd Ochoa to be D/C'd Home per MD order.  AVS completed.   Removed Ivs-CDI. Reviewed d/c paperwork with patient, wife, and son. Reviewed instructions for drain care. Gave dressing supplies. Wheeled stable patient and belongings to amin entrance where he was picked up by his son to d/c to home. Patient/caregiver able to verbalize understanding.  Discharge Medication: Allergies as of 02/17/2022   No Known Allergies      Medication List     TAKE these medications    acetaminophen 500 MG tablet Commonly known as: TYLENOL Take 2 tablets (1,000 mg total) by mouth every 6 (six) hours as needed for mild pain or fever.   ibuprofen 800 MG tablet Commonly known as: ADVIL Take 1 tablet (800 mg total) by mouth every 8 (eight) hours as needed for moderate pain. Take with food   traMADol 50 MG tablet Commonly known as: ULTRAM Take 1 tablet (50 mg total) by mouth every 6 (six) hours as needed for moderate pain.        Discharge Assessment: Vitals:   02/17/22 0450 02/17/22 0821  BP: 135/88 134/84  Pulse: 66 70  Resp: 16 18  Temp: 98.3 F (36.8 C) 98.4 F (36.9 C)  SpO2: 97% 95%   Skin clean, dry and intact without evidence of skin break down, no evidence of skin tears noted. IV catheter discontinued intact. Site without signs and symptoms of complications - no redness or edema noted at insertion site, patient denies c/o pain - only slight tenderness at site.  Dressing with slight pressure applied.  D/c Instructions-Education: Discharge instructions given to patient/family with verbalized understanding. D/c education completed with patient/family including follow up instructions, medication list, d/c activities limitations if indicated, with other d/c instructions as indicated by MD - patient able to verbalize understanding, all questions fully answered. Patient instructed to return to ED, call 911, or call MD for  any changes in condition.  Patient escorted via WC, and D/C home via private auto.  Karolee Ohs, RN 02/17/2022 1:12 PM

## 2022-02-17 NOTE — Discharge Instructions (Signed)
[facility] LAPAROSCOPIC SURGERY: POST OP INSTRUCTIONS Always review your discharge instruction sheet given to you by the facility where your surgery was performed. IF YOU HAVE DISABILITY OR FAMILY LEAVE FORMS, YOU MUST BRING THEM TO THE OFFICE FOR PROCESSING.   DO NOT GIVE THEM TO YOUR DOCTOR.  PAIN CONTROL  First take acetaminophen (Tylenol) AND/or ibuprofen (Advil) to control your pain after surgery.  Follow directions on package.  Taking acetaminophen (Tylenol) and/or ibuprofen (Advil) regularly after surgery will help to control your pain and lower the amount of prescription pain medication you may need.  You should not take more than 3,000 mg (3 grams) of acetaminophen (Tylenol) in 24 hours.  You should not take ibuprofen (Advil), aleve, motrin, naprosyn or other NSAIDS if you have a history of stomach ulcers or chronic kidney disease.  A prescription for pain medication may be given to you upon discharge.  Take your pain medication as prescribed, if you still have uncontrolled pain after taking acetaminophen (Tylenol) or ibuprofen (Advil). Use ice packs to help control pain. If you need a refill on your pain medication, please contact your pharmacy.  They will contact our office to request authorization. Prescriptions will not be filled after 5pm or on week-ends.  HOME MEDICATIONS Take your usually prescribed medications unless otherwise directed.  DIET You should follow a light diet the first few days after arrival home.  Be sure to include lots of fluids daily. Avoid fatty, fried foods.   CONSTIPATION It is common to experience some constipation after surgery and if you are taking pain medication.  Increasing fluid intake and taking a stool softener (such as Colace) will usually help or prevent this problem from occurring.  A mild laxative (Milk of Magnesia or Miralax) should be taken according to package instructions if there are no bowel movements after 48  hours.  WOUND/INCISION CARE Most patients will experience some swelling and bruising in the area of the incisions.  Ice packs will help.  Swelling and bruising can take several days to resolve.  Unless discharge instructions indicate otherwise, follow guidelines below  STERI-STRIPS - you may remove your outer bandages 48 hours after surgery, and you may shower at that time.  You have steri-strips (small skin tapes) in place directly over the incision.  These strips should be left on the skin for 7-10 days.   DERMABOND/SKIN GLUE - you may shower in 24 hours.  The glue will flake off over the next 2-3 weeks. Any sutures or staples will be removed at the office during your follow-up visit.  ACTIVITIES You may resume regular (light) daily activities beginning the next day--such as daily self-care, walking, climbing stairs--gradually increasing activities as tolerated.  You may have sexual intercourse when it is comfortable.  Refrain from any heavy lifting or straining until approved by your doctor. You may drive when you are no longer taking prescription pain medication, you can comfortably wear a seatbelt, and you can safely maneuver your car and apply brakes.  FOLLOW-UP You should see your doctor in the office for a follow-up appointment approximately 2-3 weeks after your surgery.  You should have been given your post-op/follow-up appointment when your surgery was scheduled.  If you did not receive a post-op/follow-up appointment, make sure that you call for this appointment within a day or two after you arrive home to insure a convenient appointment time.   WHEN TO CALL YOUR DOCTOR: Fever over 101.0 Inability to urinate Continued bleeding from incision.   Increased pain, redness, or drainage from the incision. Increasing abdominal pain  The clinic staff is available to answer your questions during regular business hours.  Please don't hesitate to call and ask to speak to one of the nurses for  clinical concerns.  If you have a medical emergency, go to the nearest emergency room or call 911.  A surgeon from [facility] is always on call at the hospital. [address], [address], [place], [place]  [address] ? [address] [address], [place], [place]   [address] [phone] ? [phone] ? FAX [phone] Web site: [email or web address]  

## 2022-02-19 LAB — SURGICAL PATHOLOGY

## 2023-02-11 ENCOUNTER — Encounter (HOSPITAL_COMMUNITY): Payer: Self-pay

## 2023-02-11 ENCOUNTER — Ambulatory Visit (HOSPITAL_COMMUNITY)
Admission: RE | Admit: 2023-02-11 | Discharge: 2023-02-11 | Disposition: A | Discharge status: Home / Self Care | Payer: Medicare Other | Source: Ambulatory Visit | Attending: Family Medicine | Admitting: Family Medicine

## 2023-02-11 VITALS — BP 137/90 | HR 65 | Temp 97.7°F | Resp 16

## 2023-02-11 DIAGNOSIS — H7091 Unspecified mastoiditis, right ear: Secondary | ICD-10-CM | POA: Diagnosis not present

## 2023-02-11 MED ORDER — PREDNISONE 20 MG PO TABS
20.0000 mg | ORAL_TABLET | Freq: Every day | ORAL | 0 refills | Status: AC
Start: 2023-02-11 — End: 2023-02-16

## 2023-02-11 MED ORDER — AMOXICILLIN-POT CLAVULANATE 875-125 MG PO TABS: 1.0000 | ORAL_TABLET | Freq: Two times a day (BID) | ORAL | 0 refills | Status: AC

## 2023-02-11 NOTE — Discharge Instructions (Signed)
Take all medication as prescribed.  If your symptoms do not improve within 5 days or worsen at anytime  return for evaluation.  If your symptoms are improving continue taking medication as prescribed and complete entire 10 days of antibiotics.

## 2023-02-11 NOTE — ED Triage Notes (Addendum)
C/O right ear pain x 2 wks with drainage; pain now extending into right posterior head. Denies fevers. Has been taking Advil.

## 2023-02-11 NOTE — ED Provider Notes (Signed)
[facility]    CSN: [id number] Arrival date & time: 02/11/23  0102      History   Chief Complaint Chief Complaint  Patient presents with   Ear Drainage   Appt 1000    HPI Lawrnce Ochoa is a 57 y.o. male.   HPI Patient presents for evaluation of right ear pain with drainage x 2 weeks. Endorses due to the pain he is now experiencing pain on right side of his head that is radiating to the back of the head. Patient is accompanied by his family member who is assisting with interpretation.  On further discussion, patient is actually asked me send pain behind the right ear mildly lateral aspect of the right head which is radiating down the right occipital area.  Patient has tenderness with palpation of the mastoid region of the right side of head and pain postauricular area of right side of head. He is concerned that he has cerumen impacted in the right ear and thinks that this is causing the headache pain. Denies any other associated symptoms of URI symptoms or any pain involving the left ear or left side of head. Past Medical History:  Diagnosis Date   Hypertension     Patient Active Problem List   Diagnosis Date Noted   Acute cholecystitis 02/14/2022    Past Surgical History:  Procedure Laterality Date   ANKLE SURGERY Left    CHOLECYSTECTOMY N/A 02/15/2022   Procedure: LAPAROSCOPIC CHOLECYSTECTOMY;  Surgeon: Diamantina Monks, MD;  Location: [facility] OR;  Service: General;  Laterality: N/A;   SHOULDER SURGERY         Home Medications    Prior to Admission medications   Medication Sig Start Date End Date Taking? Authorizing Provider  amoxicillin-clavulanate (AUGMENTIN) 875-125 MG tablet Take 1 tablet by mouth 2 (two) times daily for 10 days. 02/11/23 02/21/23 Yes Bing Neighbors, NP  predniSONE (DELTASONE) 20 MG tablet Take 1 tablet (20 mg total) by mouth daily with breakfast for 5 days. 02/11/23 02/16/23 Yes Bing Neighbors, NP  acetaminophen (TYLENOL) 500 MG tablet Take  2 tablets (1,000 mg total) by mouth every 6 (six) hours as needed for mild pain or fever. 02/17/22   Juliet Rude, PA-C  ibuprofen (ADVIL) 800 MG tablet Take 1 tablet (800 mg total) by mouth every 8 (eight) hours as needed for moderate pain. Take with food 02/17/22   Juliet Rude, PA-C  traMADol (ULTRAM) 50 MG tablet Take 1 tablet (50 mg total) by mouth every 6 (six) hours as needed for moderate pain. 02/17/22   Juliet Rude, PA-C    Family History History reviewed. No pertinent family history.  Social History Social History   Tobacco Use   Smoking status: Never   Smokeless tobacco: Never  Vaping Use   Vaping Use: Never used  Substance Use Topics   Alcohol use: No   Drug use: Never     Allergies   Patient has no known allergies.  Review of Systems Review of Systems Pertinent negatives listed in HPI   Physical Exam Triage Vital Signs ED Triage Vitals  Enc Vitals Group     BP 02/11/23 1001 (!) 137/90     Pulse Rate 02/11/23 1001 65     Resp 02/11/23 1001 16     Temp 02/11/23 1001 97.7 F (36.5 C)     Temp Source 02/11/23 1001 Oral     SpO2 02/11/23 1001 94 %     Weight --  Height --      Head Circumference --      Peak Flow --      Pain Score 02/11/23 1002 7     Pain Loc --      Pain Edu? --      Excl. in GC? --    No data found.  Updated Vital Signs BP (!) 137/90   Pulse 65   Temp 97.7 F (36.5 C) (Oral)   Resp 16   SpO2 94%   Visual Acuity Right Eye Distance:   Left Eye Distance:   Bilateral Distance:    Right Eye Near:   Left Eye Near:    Bilateral Near:     Physical Exam Vitals reviewed.  HENT:     Head: Normocephalic and atraumatic.      Right Ear: Tympanic membrane normal.     Left Ear: Tympanic membrane normal.  Musculoskeletal:     Cervical back: Normal range of motion and neck supple.  Neurological:     General: No focal deficit present.     Mental Status: He is alert.      UC Treatments / Results  Labs (all labs  ordered are listed, but only abnormal results are displayed) Labs Reviewed - No data to display  EKG   Radiology No results found.  Procedures Procedures (including critical care time)  Medications Ordered in UC Medications - No data to display  Initial Impression / Assessment and Plan / UC Course  I have reviewed the triage vital signs and the nursing notes.  Pertinent labs & imaging results that were available during my care of the patient were reviewed by me and considered in my medical decision making (see chart for details).    Mastoiditis of right side of head treatment with prednisone 20 mg once daily for 5 days and Augmentin twice daily for 10 days.  Patient encouraged to return if symptoms have not significantly improved within 5 days or at any point if they worsen.  Symptoms have improved within 5 days continue and complete entire course of antibiotics and return as needed. Final Clinical Impressions(s) / UC Diagnoses   Final diagnoses:  Mastoiditis of right side     Discharge Instructions      Take all medication as prescribed.  If your symptoms do not improve within 5 days or worsen at anytime  return for evaluation.  If your symptoms are improving continue taking medication as prescribed and complete entire 10 days of antibiotics.     ED Prescriptions     Medication Sig Dispense Auth. Provider   predniSONE (DELTASONE) 20 MG tablet Take 1 tablet (20 mg total) by mouth daily with breakfast for 5 days. 5 tablet Bing Neighbors, NP   amoxicillin-clavulanate (AUGMENTIN) 875-125 MG tablet Take 1 tablet by mouth 2 (two) times daily for 10 days. 20 tablet Bing Neighbors, NP      PDMP not reviewed this encounter.   Bing Neighbors, NP 02/11/23 [phone]

## 2023-11-19 ENCOUNTER — Ambulatory Visit
Admission: RE | Admit: 2023-11-19 | Discharge: 2023-11-19 | Disposition: A | Discharge status: Home / Self Care | Source: Ambulatory Visit | Attending: Family Medicine | Admitting: Family Medicine

## 2023-11-19 VITALS — BP 129/76 | HR 63 | Temp 97.9°F | Resp 20

## 2023-11-19 DIAGNOSIS — R739 Hyperglycemia, unspecified: Secondary | ICD-10-CM | POA: Diagnosis not present

## 2023-11-19 DIAGNOSIS — R42 Dizziness and giddiness: Secondary | ICD-10-CM

## 2023-11-19 LAB — POCT URINALYSIS DIP (MANUAL ENTRY)
Bilirubin, UA: NEGATIVE
Blood, UA: NEGATIVE
Glucose, UA: >=1000 mg/dL — AB
Ketones, POC UA: NEGATIVE mg/dL
Leukocytes, UA: NEGATIVE
Nitrite, UA: NEGATIVE
Protein Ur, POC: NEGATIVE mg/dL
Spec Grav, UA: 1.01 (ref 1.010–1.025)
Urobilinogen, UA: 0.2 U/dL
pH, UA: 6 (ref 5.0–8.0)

## 2023-11-19 LAB — POCT FASTING CBG KUC MANUAL ENTRY: POCT Glucose (KUC): 337 mg/dL — AB (ref 70–99)

## 2023-11-19 NOTE — Discharge Instructions (Addendum)
 Please follow up with me on Monday for lab review and to start your medications.   For diabetes or elevated blood sugar, please make sure you are limiting and avoiding starchy, carbohydrate foods like pasta, breads, sweet breads, pastry, rice, potatoes, desserts. These foods can elevate your blood sugar. Also, limit and avoid drinks that contain a lot of sugar such as sodas, sweet teas, fruit juices.  Drinking plain water will be much more helpful, try 80 ounces of water daily.  It is okay to flavor your water naturally by cutting cucumber, lemon, mint or lime, placing it in a picture with water and drinking it over a period of 24-48 hours as long as it remains refrigerated.  For elevated blood pressure, make sure you are monitoring salt in your diet.  Do not eat restaurant foods and limit processed foods at home. I highly recommend you prepare and cook your own foods at home.  Processed foods include things like frozen meals, pre-seasoned meats and dinners, deli meats, canned foods as these foods contain a high amount of sodium/salt.  Make sure you are paying attention to sodium labels on foods you buy at the grocery store. Buy your spices separately such as garlic powder, onion powder, cumin, cayenne, parsley flakes so that you can avoid seasonings that contain salt. However, salt-free seasonings are available and can be used, an example is Mrs. Dash and includes a lot of different mixtures that do not contain salt.  Lastly, when cooking using oils that are healthier for you is important. This includes olive oil, avocado oil, canola oil. We have discussed a lot of foods to avoid but below is a list of foods that can be very healthy to use in your diet whether it is for diabetes, cholesterol, high blood pressure, or in general healthy eating.  Salads - kale, spinach, cabbage, spring mix, arugula Fruits - avocadoes, berries (blueberries, raspberries, blackberries), apples, oranges, pomegranate, grapefruit,  kiwi Vegetables - asparagus, cauliflower, broccoli, green beans, brussel sprouts, bell peppers, beets; stay away from or limit starchy vegetables like potatoes, carrots, peas Other general foods - kidney beans, egg whites, almonds, walnuts, sunflower seeds, pumpkin seeds, fat free yogurt, almond milk, flax seeds, quinoa, oats  Meat - It is better to eat lean meats and limit your red meat including pork to once a week.  Wild caught fish, chicken breast are good options as they tend to be leaner sources of good protein. Still be mindful of the sodium labels for the meats you buy.  DO NOT EAT ANY FOODS ON THIS LIST THAT YOU ARE ALLERGIC TO. For more specific needs, I highly recommend consulting a dietician or nutritionist but this can definitely be a good starting point.

## 2023-11-19 NOTE — ED Provider Notes (Signed)
 [facility]  Note:  This document was prepared using Conservation officer, historic buildings and may include unintentional dictation errors.  MRN: [id number] DOB: Feb 17, 1966  Subjective:   Todd Ochoa is a 58 y.o. male presenting for 2-week history of persistent dizziness, nausea without vomiting, feeling achy and feeling woozy.  Denies headache, confusion, numbness or tingling, chest pain, palpitations, abdominal pain, hematuria, rashes.  To the best of his knowledge, he is in good health.  Has a history of hypertension but does not take anything for this.  No chronic medications.  No Known Allergies  Past Medical History:  Diagnosis Date   Hypertension      Past Surgical History:  Procedure Laterality Date   ANKLE SURGERY Left    CHOLECYSTECTOMY N/A 02/15/2022   Procedure: LAPAROSCOPIC CHOLECYSTECTOMY;  Surgeon: Diamantina Monks, MD;  Location: [facility] OR;  Service: General;  Laterality: N/A;   SHOULDER SURGERY      No family history on file.  Social History   Tobacco Use   Smoking status: Never   Smokeless tobacco: Never  Vaping Use   Vaping status: Never Used  Substance Use Topics   Alcohol use: No   Drug use: Never    ROS   Objective:   Vitals: BP 129/76 (BP Location: Left Arm)   Pulse 63   Temp 97.9 F (36.6 C) (Oral)   Resp 20   SpO2 97%   Physical Exam Constitutional:      General: He is not in acute distress.    Appearance: Normal appearance. He is well-developed and normal weight. He is not ill-appearing, toxic-appearing or diaphoretic.  HENT:     Head: Normocephalic and atraumatic.     Right Ear: External ear normal.     Left Ear: External ear normal.     Nose: Nose normal.     Mouth/Throat:     Mouth: Mucous membranes are moist.     Pharynx: Oropharynx is clear. No pharyngeal swelling, oropharyngeal exudate, posterior oropharyngeal erythema or uvula swelling.     Tonsils: No tonsillar exudate or tonsillar abscesses. 0 on the  right. 0 on the left.  Eyes:     General: No scleral icterus.       Right eye: No discharge.        Left eye: No discharge.     Extraocular Movements: Extraocular movements intact.     Conjunctiva/sclera: Conjunctivae normal.  Cardiovascular:     Rate and Rhythm: Normal rate and regular rhythm.     Heart sounds: Normal heart sounds. No murmur heard.    No friction rub. No gallop.  Pulmonary:     Effort: Pulmonary effort is normal. No respiratory distress.     Breath sounds: Normal breath sounds. No stridor. No wheezing, rhonchi or rales.  Abdominal:     General: Bowel sounds are normal. There is no distension.     Palpations: Abdomen is soft. There is no mass.     Tenderness: There is no abdominal tenderness. There is no right CVA tenderness, left CVA tenderness, guarding or rebound.  Musculoskeletal:     Cervical back: Normal range of motion.  Skin:    General: Skin is warm and dry.  Neurological:     Mental Status: He is alert and oriented to person, place, and time.     Cranial Nerves: No cranial nerve deficit.     Sensory: No sensory deficit.     Motor: No weakness.  Coordination: Coordination normal.     Gait: Gait normal.     Deep Tendon Reflexes: Reflexes normal.  Psychiatric:        Mood and Affect: Mood normal.        Behavior: Behavior normal.        Thought Content: Thought content normal.        Judgment: Judgment normal.     Results for orders placed or performed during the hospital encounter of 11/19/23 (from the past 24 hours)  POCT CBG (manual entry)     Status: Abnormal   Collection Time: 11/19/23  3:55 PM  Result Value Ref Range   POCT Glucose ([facility]) 337 (A) 70 - 99 mg/dL    Assessment and Plan :   PDMP not reviewed this encounter.  1. Dizziness   2. Hyperglycemia    Patient last ate lunch 2 hours prior to his blood sugar check.  High suspicion for diabetes as the primary source of his symptoms.  Will do comprehensive metabolic panel and  confirmation A1c check.  CBC also could potentially provide high yield.  Recommended significant dietary changes.  Plan is to follow-up with patient on Monday and review labs then, prescribe medications as appropriate.  Counseled patient on potential for adverse effects with medications prescribed/recommended today, ER and return-to-clinic precautions discussed, patient verbalized understanding.    Wallis Bamberg, [place] 11/19/23 4098

## 2023-11-19 NOTE — ED Triage Notes (Addendum)
 Pt c/o pain to back of head and upper back and nausea and dizziness x 2 weeks-felt today like he was going to pass out-denies injury to pain sites-taking advil with no relief-pt speaks some english/son interpreting prn-NAD-steady gait

## 2023-11-20 LAB — COMPREHENSIVE METABOLIC PANEL WITH GFR
ALT: 17 IU/L (ref 0–44)
AST: 16 IU/L (ref 0–40)
Albumin: 4.4 g/dL (ref 3.8–4.9)
Alkaline Phosphatase: 70 IU/L (ref 44–121)
BUN/Creatinine Ratio: 13 (ref 9–20)
BUN: 11 mg/dL (ref 6–24)
Bilirubin Total: 0.3 mg/dL (ref 0.0–1.2)
CO2: 25 mmol/L (ref 20–29)
Calcium: 9.8 mg/dL (ref 8.7–10.2)
Chloride: 98 mmol/L (ref 96–106)
Creatinine, Ser: 0.85 mg/dL (ref 0.76–1.27)
Globulin, Total: 2.6 g/dL (ref 1.5–4.5)
Glucose: 324 mg/dL — ABNORMAL HIGH (ref 70–99)
Potassium: 3.5 mmol/L (ref 3.5–5.2)
Sodium: 139 mmol/L (ref 134–144)
Total Protein: 7 g/dL (ref 6.0–8.5)
eGFR: 101 mL/min/{1.73_m2} (ref >59)

## 2023-11-20 LAB — CBC
Hematocrit: 47.8 % (ref 37.5–51.0)
Hemoglobin: 15.7 g/dL (ref 13.0–17.7)
MCH: 29.2 pg (ref 26.6–33.0)
MCHC: 32.8 g/dL (ref 31.5–35.7)
MCV: 89 fL (ref 79–97)
Platelets: 184 10*3/uL (ref 150–450)
RBC: 5.37 x10E6/uL (ref 4.14–5.80)
RDW: 12.7 % (ref 11.6–15.4)
WBC: 6.3 10*3/uL (ref 3.4–10.8)

## 2023-11-20 LAB — HEMOGLOBIN A1C
Est. average glucose Bld gHb Est-mCnc: 269 mg/dL
Hgb A1c MFr Bld: 11 % — ABNORMAL HIGH (ref 4.8–5.6)

## 2023-11-23 ENCOUNTER — Ambulatory Visit
Admission: RE | Admit: 2023-11-23 | Discharge: 2023-11-23 | Disposition: A | Discharge status: Home / Self Care | Payer: Self-pay | Source: Ambulatory Visit | Attending: Family Medicine | Admitting: Family Medicine

## 2023-11-23 VITALS — BP 111/74 | HR 63 | Temp 97.9°F | Resp 18

## 2023-11-23 DIAGNOSIS — E119 Type 2 diabetes mellitus without complications: Secondary | ICD-10-CM | POA: Diagnosis not present

## 2023-11-23 MED ORDER — METFORMIN HCL 1000 MG PO TABS: 1000.0000 mg | ORAL_TABLET | Freq: Two times a day (BID) | ORAL | 0 refills | Status: AC

## 2023-11-23 MED ORDER — GLIPIZIDE 2.5 MG PO TABS: 1.0000 | ORAL_TABLET | Freq: Two times a day (BID) | ORAL | 2 refills | Status: AC

## 2023-11-23 MED ORDER — LISINOPRIL 5 MG PO TABS: 5.0000 mg | ORAL_TABLET | Freq: Every day | ORAL | 0 refills | Status: AC

## 2023-11-23 NOTE — ED Triage Notes (Signed)
 Patient states he is here to discuss his most recent blood sugar results from last week.

## 2023-11-23 NOTE — Discharge Instructions (Addendum)
 Metformin Dosing (to be taken with food) Week 1: take 1/2 tablet twice a day. Week 2: take 1 tablet in the morning, 1/2 tablet at night. Week 3: take 1 tablets twice a day.  Glipizide dosing Take 1 tablet 30 minutes before you eat twice daily. Make sure you eat after each tablet you take.   Lisinopril is a medication that will help protect your kidney against the effects of diabetes.   Make an appointment with diabetes nutrition counseling. Make another appointment with a new primary care provider for a recheck on your diabetes.

## 2023-11-23 NOTE — ED Provider Notes (Signed)
 [facility]  Note:  This document was prepared using Conservation officer, historic buildings and may include unintentional dictation errors.  MRN: [id number] DOB: May 31, 1966  Subjective:   Todd Ochoa is a 58 y.o. male presenting for recheck on his dizziness, hyperglycemia.  Patient was seen last week and had extensive blood work done.  Presents today for review and a recheck on his symptoms.  Has started trying to make dietary modifications.  No headache, confusion, chest pain, shortness of breath, nausea, vomiting, abdominal pain, dysuria, hematuria.  No rashes.  Does not have a PCP.  No current facility-administered medications for this encounter.  Current Outpatient Medications:    acetaminophen (TYLENOL) 500 MG tablet, Take 2 tablets (1,000 mg total) by mouth every 6 (six) hours as needed for mild pain or fever., Disp: , Rfl:    ibuprofen (ADVIL) 800 MG tablet, Take 1 tablet (800 mg total) by mouth every 8 (eight) hours as needed for moderate pain. Take with food, Disp: 30 tablet, Rfl: 0   traMADol (ULTRAM) 50 MG tablet, Take 1 tablet (50 mg total) by mouth every 6 (six) hours as needed for moderate pain., Disp: 20 tablet, Rfl: 0   No Known Allergies  Past Medical History:  Diagnosis Date   Hypertension      Past Surgical History:  Procedure Laterality Date   ANKLE SURGERY Left    CHOLECYSTECTOMY N/A 02/15/2022   Procedure: LAPAROSCOPIC CHOLECYSTECTOMY;  Surgeon: Diamantina Monks, MD;  Location: [facility] OR;  Service: General;  Laterality: N/A;   SHOULDER SURGERY      History reviewed. No pertinent family history.  Social History   Tobacco Use   Smoking status: Never   Smokeless tobacco: Never  Vaping Use   Vaping status: Never Used  Substance Use Topics   Alcohol use: No   Drug use: Never    ROS   Objective:   Vitals: BP 111/74 (BP Location: Left Arm)   Pulse 63   Temp 97.9 F (36.6 C) (Oral)   Resp 18   SpO2 94%   Physical  Exam Constitutional:      General: He is not in acute distress.    Appearance: Normal appearance. He is well-developed. He is not ill-appearing, toxic-appearing or diaphoretic.  HENT:     Head: Normocephalic and atraumatic.     Right Ear: External ear normal.     Left Ear: External ear normal.     Nose: Nose normal.     Mouth/Throat:     Mouth: Mucous membranes are moist.  Eyes:     General: No scleral icterus.       Right eye: No discharge.        Left eye: No discharge.     Extraocular Movements: Extraocular movements intact.  Cardiovascular:     Rate and Rhythm: Normal rate and regular rhythm.     Heart sounds: Normal heart sounds. No murmur heard.    No friction rub. No gallop.  Pulmonary:     Effort: Pulmonary effort is normal. No respiratory distress.     Breath sounds: Normal breath sounds. No stridor. No wheezing, rhonchi or rales.  Neurological:     Mental Status: He is alert and oriented to person, place, and time.  Psychiatric:        Mood and Affect: Mood normal.        Behavior: Behavior normal.        Thought Content: Thought content normal.  Recent Results (from the past 2160 hours)  POCT CBG (manual entry)     Status: Abnormal   Collection Time: 11/19/23  3:55 PM  Result Value Ref Range   POCT Glucose ([facility]) 337 (A) 70 - 99 mg/dL  POCT urinalysis dipstick     Status: Abnormal   Collection Time: 11/19/23  4:28 PM  Result Value Ref Range   Color, UA yellow yellow   Clarity, UA clear clear   Glucose, UA >=1,000 (A) negative mg/dL   Bilirubin, UA negative negative   Ketones, POC UA negative negative mg/dL   Spec Grav, UA 6.045 4.098 - 1.025   Blood, UA negative negative   pH, UA 6.0 5.0 - 8.0   Protein Ur, POC negative negative mg/dL   Urobilinogen, UA 0.2 0.2 or 1.0 E.U./dL   Nitrite, UA Negative Negative   Leukocytes, UA Negative Negative  Hemoglobin A1c     Status: Abnormal   Collection Time: 11/19/23  4:28 PM  Result Value Ref Range   Hgb A1c  MFr Bld 11.0 (H) 4.8 - 5.6 %    Comment:          Prediabetes: 5.7 - 6.4          Diabetes: >6.4          Glycemic control for adults with diabetes: <7.0    Est. average glucose Bld gHb Est-mCnc 269 mg/dL  Comprehensive metabolic panel     Status: Abnormal   Collection Time: 11/19/23  4:28 PM  Result Value Ref Range   Glucose 324 (H) 70 - 99 mg/dL   BUN 11 6 - 24 mg/dL   Creatinine, Ser 1.19 0.76 - 1.27 mg/dL   eGFR 147 >82 NF/AOZ/3.08   BUN/Creatinine Ratio 13 9 - 20   Sodium 139 134 - 144 mmol/L   Potassium 3.5 3.5 - 5.2 mmol/L   Chloride 98 96 - 106 mmol/L   CO2 25 20 - 29 mmol/L   Calcium 9.8 8.7 - 10.2 mg/dL   Total Protein 7.0 6.0 - 8.5 g/dL   Albumin 4.4 3.8 - 4.9 g/dL   Globulin, Total 2.6 1.5 - 4.5 g/dL   Bilirubin Total 0.3 0.0 - 1.2 mg/dL   Alkaline Phosphatase 70 44 - 121 IU/L   AST 16 0 - 40 IU/L   ALT 17 0 - 44 IU/L  CBC     Status: None   Collection Time: 11/19/23  4:28 PM  Result Value Ref Range   WBC 6.3 3.4 - 10.8 x10E3/uL   RBC 5.37 4.14 - 5.80 x10E6/uL   Hemoglobin 15.7 13.0 - 17.7 g/dL   Hematocrit 65.7 84.6 - 51.0 %   MCV 89 79 - 97 fL   MCH 29.2 26.6 - 33.0 pg   MCHC 32.8 31.5 - 35.7 g/dL   RDW 96.2 95.2 - 84.1 %   Platelets 184 150 - 450 x10E3/uL     Assessment and Plan :   PDMP not reviewed this encounter.  1. New onset type 2 diabetes mellitus (HCC)    Had an extensive discussion with patient about his new onset diabetes.  Reviewed diabetic friendly foods in detail.  Recommended a more in-depth consultation with diabetic nutritional counseling.  Referral placed.  Also recommended he establish care with a new PCP.  Provided him with information to to providers.  Recommended starting a regimen of metformin, glipizide.  Patient is not inclined to start insulin now and I believe this is appropriate as he is very  motivated to make significant dietary modifications.  Recheck as soon as possible with his new PCP.  Counseled patient on potential for  adverse effects with medications prescribed/recommended today, ER and return-to-clinic precautions discussed, patient verbalized understanding.    Wallis Bamberg, PA-C 11/23/23 1452

## 2023-12-01 ENCOUNTER — Encounter: Admitting: Dietician

## 2024-01-05 ENCOUNTER — Encounter: Payer: Self-pay | Admitting: Dietician

## 2024-01-05 ENCOUNTER — Ambulatory Visit: Admitting: Dietician

## 2024-01-05 ENCOUNTER — Encounter: Admitting: Dietician

## 2024-01-05 VITALS — Ht 64.0 in | Wt 147.0 lb

## 2024-01-05 DIAGNOSIS — E119 Type 2 diabetes mellitus without complications: Secondary | ICD-10-CM | POA: Insufficient documentation

## 2024-01-05 NOTE — Patient Instructions (Addendum)
 It was great to see you today. It is very important that you schedule an appointment with a Family or Primary Care doctor to follow-up on your blood sugars and medications. Below are the phone numbers to two clinics nearby that are accepting new patients:   [facility] (on the first floor of this building) [address] [place], [place] [address] [phone]  [facility] (near [facility]) [address] [place], [place], [place] [address] [phone]  Once you establish with a doctor, ask for a prescription for Accu Chek Guide Me test strips to go with the meter with gave you today. You will be able to get this for $4 for a 90 day supply at the pharmacy through insurance.   Continue to stay active. Continue to eat a healthy diet.  Half your plate should be vegetables, small portion of protein such as chicken, fish, or egg.    Choose brown rice rather than white most often.    Continue to take your medication.

## 2024-01-05 NOTE — Progress Notes (Signed)
 Diabetes Self-Management Education  Visit Type: First/Initial  Appt. Start Time: 1445 Appt. End Time: 1600  01/08/2024  [name], identified by name and date of birth, is a 58 y.o. male with a diagnosis of Diabetes: Type 2.   ASSESSMENT Patient is here today with his son.  Patient understands some English but patient's son interpreted a little.  Referral:  Type 2 Diabetes (newly diagnosed)  History includes:  Type 2 Diabetes, HTN Medications include:  Glipizide , Metformin  Labs:  11% 11/19/2023  64" 147 lbs 01/05/2024  lost weight due to diet changes 160 lbs  11/19/2023   Patient was given the following blood glucose meter: Accu Chek Guide Me Lot [id number] Expiration date 08/04/2024 Blood glucose was 89 3 hours after eating  Patient lives with his wife and son.  Patient does some of the shopping and patient does the cooking.  He is not employed. No alcohol or smoking since 1991.  Height 5\' 4"  (1.626 m), weight 147 lb (66.7 kg). Body mass index is 25.23 kg/m.   Diabetes Self-Management Education - 01/05/24 1515       Visit Information   Visit Type First/Initial      Initial Visit   Diabetes Type Type 2    Date Diagnosed 11/2023    Are you currently following a meal plan? No    Are you taking your medications as prescribed? Yes      Health Coping   How would you rate your overall health? Excellent      Psychosocial Assessment   Patient Belief/Attitude about Diabetes Motivated to manage diabetes    What is the hardest part about your diabetes right now, causing you the most concern, or is the most worrisome to you about your diabetes?   Making healty food and beverage choices    Self-care barriers English as a second language    Self-management support Doctor's office;Family;CDE visits    Other persons present Patient;Family Member    Patient Concerns Nutrition/Meal planning;Glycemic Control    Special Needs None    Preferred Learning Style No preference  indicated    Learning Readiness Ready    How often do you need to have someone help you when you read instructions, pamphlets, or other written materials from your doctor or pharmacy? 5 - Always    What is the last grade level you completed in school? n/a      Pre-Education Assessment   Patient understands the diabetes disease and treatment process. Needs Instruction    Patient understands incorporating nutritional management into lifestyle. Needs Instruction    Patient undertands incorporating physical activity into lifestyle. Needs Instruction    Patient understands using medications safely. Needs Instruction    Patient understands monitoring blood glucose, interpreting and using results Needs Instruction    Patient understands prevention, detection, and treatment of acute complications. Needs Instruction    Patient understands prevention, detection, and treatment of chronic complications. Needs Instruction    Patient understands how to develop strategies to address psychosocial issues. Needs Instruction    Patient understands how to develop strategies to promote health/change behavior. Needs Instruction      Complications   Last HgB A1C per patient/outside source 11 %    How often do you check your blood sugar? 3-4 times / week    Postprandial Blood glucose range (mg/dL) 295-284    Number of hypoglycemic episodes per month 0    Have you had a dilated eye exam in the past 12 months?  No    Have you had a dental exam in the past 12 months? No    Are you checking your feet? Yes    How many days per week are you checking your feet? 4      Dietary Intake   Breakfast cucumber, orange, tomato   noon today   Snack (morning) none    Lunch brown rice, chicken    Snack (afternoon) mango, orange    Dinner brown rice, chicken    Snack (evening) none    Beverage(s) water, unsweetened tea      Activity / Exercise   Activity / Exercise Type Light (walking / raking leaves)    How many days per  week do you exercise? 5    How many minutes per day do you exercise? 30    Total minutes per week of exercise 150      Patient Education   Previous Diabetes Education No    Disease Pathophysiology Definition of diabetes, type 1 and 2, and the diagnosis of diabetes    Healthy Eating Meal options for control of blood glucose level and chronic complications.;Plate Method;Role of diet in the treatment of diabetes and the relationship between the three main macronutrients and blood glucose level;Meal timing in regards to the patients' current diabetes medication.    Being Active Role of exercise on diabetes management, blood pressure control and cardiac health.    Medications Reviewed patients medication for diabetes, action, purpose, timing of dose and side effects.    Monitoring Taught/evaluated SMBG meter.;Identified appropriate SMBG and/or A1C goals.    Acute complications Taught prevention, symptoms, and  treatment of hypoglycemia - the 15 rule.;Discussed and identified patients' prevention, symptoms, and treatment of hyperglycemia.    Diabetes Stress and Support Identified and addressed patients feelings and concerns about diabetes;Worked with patient to identify barriers to care and solutions      Individualized Goals (developed by patient)   Nutrition General guidelines for healthy choices and portions discussed    Physical Activity Exercise 5-7 days per week;30 minutes per day    Medications take my medication as prescribed    Monitoring  Test my blood glucose as discussed    Problem Solving Medication consistency;Eating Pattern;Addressing barriers to behavior change    Reducing Risk examine blood glucose patterns;do foot checks daily;treat hypoglycemia with 15 grams of carbs if blood glucose less than 70mg /dL      Post-Education Assessment   Patient understands the diabetes disease and treatment process. Needs Review    Patient understands incorporating nutritional management into  lifestyle. Needs Review    Patient undertands incorporating physical activity into lifestyle. Comprehends key points    Patient understands using medications safely. Comphrehends key points    Patient understands monitoring blood glucose, interpreting and using results Needs Review    Patient understands prevention, detection, and treatment of acute complications. Comprehends key points    Patient understands prevention, detection, and treatment of chronic complications. Needs Review    Patient understands how to develop strategies to address psychosocial issues. Needs Review    Patient understands how to develop strategies to promote health/change behavior. Needs Review      Outcomes   Expected Outcomes Demonstrated interest in learning but significant barriers to change    Future DMSE 2 months    Program Status Not Completed             Individualized Plan for Diabetes Self-Management Training:   Learning Objective:  Patient will have a  greater understanding of diabetes self-management. Patient education plan is to attend individual and/or group sessions per assessed needs and concerns.   Plan:   Patient Instructions  It was great to see you today. It is very important that you schedule an appointment with a Family or Primary Care doctor to follow-up on your blood sugars and medications. Below are the phone numbers to two clinics nearby that are accepting new patients:   [facility] (on the first floor of this building) [address] [place], [place] [address] [phone]  [facility] (near [facility]) [address] [place], [place], [place] [address] [phone]  Once you establish with a doctor, ask for a prescription for Accu Chek Guide Me test strips to go with the meter with gave you today. You will be able to get this for $4 for a 90 day supply at the pharmacy through insurance.   Continue to stay  active. Continue to eat a healthy diet.  Half your plate should be vegetables, small portion of protein such as chicken, fish, or egg.    Choose brown rice rather than white most often.    Continue to take your medication.     Expected Outcomes:  Demonstrated interest in learning but significant barriers to change  Education material provided: Where Do I begin? Living with Type 2 Diabetes  If problems or questions, patient to contact team via:  Phone  Future DSME appointment: 2 months

## 2024-03-11 ENCOUNTER — Encounter: Attending: Family Medicine | Admitting: Dietician

## 2024-03-11 ENCOUNTER — Encounter: Payer: Self-pay | Admitting: Dietician

## 2024-03-11 DIAGNOSIS — E119 Type 2 diabetes mellitus without complications: Secondary | ICD-10-CM | POA: Insufficient documentation

## 2024-03-11 NOTE — Patient Instructions (Addendum)
 Call or visit one of the offices below today to see if you can find a primary care doctor to help with your diabetes. (Currently using urgent care.)  He is out of LIsinopril , Glipizide  and almost out of Metformin .  Fasting glucose today was 121 and post meal last night was 180.  [facility] Address: [address] [place] [address], [place], [place] [address] Phone: [phone]  [facility] [address] [place] [address], [place], [place] [address] [phone]     You also need an eye exam. Check your blood glucose 2 times daily Walk 30 minutes or more daily.

## 2024-03-11 NOTE — Progress Notes (Signed)
 Diabetes Self-Management Education  Visit Type: Follow-up  Appt. Start Time: 0950 Appt. End Time: 1020  03/20/2024  [name], identified by name and date of birth, is a 58 y.o. male with a diagnosis of Diabetes:  .   ASSESSMENT Patient is here today with his son.  Patient understands some English but patient's son interpreted a little.  They were last seen by this RD on 01/05/2024.   A new A1C is not available and patient does not know if this has been checked. He checks his blood glucose once per week.  Instructed him to learn to do this, to check 2 times daily and how to log the information. He states that he is having problems with his vision.  Instructed him to get an eye exam and optional locations.  He has not found a PCP.  He is out of Metformin  and only 1 more pill of Glipizde. Provided him with numbers to [facility] and the [facility] which are both located in this building. Previously has used Urgent Care.  Referral:  Type 2 Diabetes (newly diagnosed)   History includes:  Type 2 Diabetes, HTN Medications include:  Glipizide , Metformin  Labs:  11% 11/19/2023   64 151 lbs 03/11/2024 147 lbs 01/05/2024  lost weight due to diet changes 160 lbs  11/19/2023    Patient lives with his wife and son.  Patient does some of the shopping and patient does the cooking.  He is not employed. No alcohol or smoking since 1991. He is walking 10-20 minutes per day. 2  Diabetes Self-Management Education - 03/17/24 1351       Visit Information   Visit Type Follow-up      Psychosocial Assessment   Patient Belief/Attitude about Diabetes Motivated to manage diabetes    What is the hardest part about your diabetes right now, causing you the most concern, or is the most worrisome to you about your diabetes?   Checking blood sugar;Taking/obtaining medications    Self-care barriers English as a second language    Self-management support Doctor's  office    Other persons present Patient;Family Member    Patient Concerns Glycemic Control;Monitoring    Special Needs None    Preferred Learning Style No preference indicated    Learning Readiness Ready      Pre-Education Assessment   Patient understands the diabetes disease and treatment process. Needs Review    Patient understands incorporating nutritional management into lifestyle. Needs Review    Patient undertands incorporating physical activity into lifestyle. Needs Review    Patient understands using medications safely. Needs Review    Patient understands monitoring blood glucose, interpreting and using results Needs Review    Patient understands prevention, detection, and treatment of acute complications. Needs Review    Patient understands prevention, detection, and treatment of chronic complications. Needs Review    Patient understands how to develop strategies to address psychosocial issues. Needs Review    Patient understands how to develop strategies to promote health/change behavior. Needs Review      Complications   How often do you check your blood sugar? 0 times/day (not testing)   once per week by son   Number of hypoglycemic episodes per month 1    Can you tell when your blood sugar is low? Yes    What do you do if your blood sugar is low? drink juice      Dietary Intake   Breakfast white rice  Snack (morning) none    Lunch white rice, asparagus, air fried fish    Snack (afternoon) mango, kiwi    Dinner white rice, vegetables, fish    Snack (evening) none    Beverage(s) water      Activity / Exercise   Activity / Exercise Type Light (walking / raking leaves)    How many days per week do you exercise? 7    How many minutes per day do you exercise? 15    Total minutes per week of exercise 105      Patient Education   Previous Diabetes Education Yes   12/2023   Healthy Eating Meal options for control of blood glucose level and chronic complications.     Being Active Role of exercise on diabetes management, blood pressure control and cardiac health.    Medications Reviewed patients medication for diabetes, action, purpose, timing of dose and side effects.    Monitoring Taught/evaluated SMBG meter.;Identified appropriate SMBG and/or A1C goals.    Acute complications Taught prevention, symptoms, and  treatment of hypoglycemia - the 15 rule.      Individualized Goals (developed by patient)   Nutrition General guidelines for healthy choices and portions discussed    Physical Activity Exercise 5-7 days per week;30 minutes per day    Medications take my medication as prescribed    Monitoring  Test my blood glucose as discussed    Problem Solving Addressing barriers to behavior change    Reducing Risk treat hypoglycemia with 15 grams of carbs if blood glucose less than 70mg /dL;Other (comment)   obtain PCP     Patient Self-Evaluation of Goals - Patient rates self as meeting previously set goals (% of time)   Nutrition >75% (most of the time)    Physical Activity 25 - 50% (sometimes)    Medications 25 - 50% (sometimes)    Monitoring 25 - 50% (sometimes)    Problem Solving and behavior change strategies  25 - 50% (sometimes)    Reducing Risk (treating acute and chronic complications) 25 - 50% (sometimes)    Health Coping 50 - 75 % (half of the time)      Post-Education Assessment   Patient understands the diabetes disease and treatment process. Comprehends key points    Patient understands incorporating nutritional management into lifestyle. Comprehends key points    Patient undertands incorporating physical activity into lifestyle. Comprehends key points    Patient understands using medications safely. Needs Review    Patient understands monitoring blood glucose, interpreting and using results Needs Review    Patient understands prevention, detection, and treatment of acute complications. Needs Review    Patient understands prevention, detection,  and treatment of chronic complications. Comprehends key points    Patient understands how to develop strategies to address psychosocial issues. Needs Review    Patient understands how to develop strategies to promote health/change behavior. Needs Review      Outcomes   Expected Outcomes Demonstrated interest in learning but significant barriers to change    Future DMSE 2 months    Program Status Not Completed      Subsequent Visit   Since your last visit have you continued or begun to take your medications as prescribed? Yes    Since your last visit have you experienced any weight changes? Gain    Weight Gain (lbs) 4    Since your last visit, are you checking your blood glucose at least once a day? No  Individualized Plan for Diabetes Self-Management Training:   Learning Objective:  Patient will have a greater understanding of diabetes self-management. Patient education plan is to attend individual and/or group sessions per assessed needs and concerns.   Plan:   Patient Instructions  Call or visit one of the offices below today to see if you can find a primary care doctor to help with your diabetes. (Currently using urgent care.)  He is out of LIsinopril , Glipizide  and almost out of Metformin .  Fasting glucose today was 121 and post meal last night was 180.  [facility] Address: [address] [place] [address], [place], [place] [address] Phone: [phone]  [facility] [address], [place], [place] [address] [phone]     You also need an eye exam. Check your blood glucose 2 times daily Walk 30 minutes or more daily.     Expected Outcomes:  Demonstrated interest in learning but significant barriers to change  Education material provided:   If problems or questions, patient to contact team via:  Phone  Future DSME appointment: 2 months

## 2024-05-13 ENCOUNTER — Encounter: Attending: Orthopedic Surgery | Admitting: Dietician

## 2024-05-13 ENCOUNTER — Encounter: Payer: Self-pay | Admitting: Dietician

## 2024-05-13 VITALS — Wt 152.0 lb

## 2024-05-13 DIAGNOSIS — E119 Type 2 diabetes mellitus without complications: Secondary | ICD-10-CM | POA: Diagnosis present

## 2024-05-13 NOTE — Patient Instructions (Addendum)
 Blood glucose goals:  80-130 fasting   100-180 two hours after starting any meal  Generally a rise of 40-60 points after a meal is normal  A1C Goal:   Less than 7%.  Your last A1C was  Lab Results  Component Value Date   HGBA1C 11.0 (H) 11/19/2023     Continue to eat a healthy diet Be active for at least 30 minutes most days. Drink water and unsweetened tea Check your blood glucose daily  Keep your doctor's appointment

## 2024-05-13 NOTE — Progress Notes (Signed)
 Diabetes Self-Management Education  Visit Type: Follow-up  Appt. Start Time: 0950 Appt. End Time: 1030  05/13/2024  Todd Ochoa, identified by name and date of birth, is a 58 y.o. male with a diagnosis of Diabetes:  .   ASSESSMENT Patient is here today with his son.  Patient understands some English but patient's son interpreted a little.  They were last seen by this RD on 03/11/2024.   No new A1C is available.  Patient has not been to a PCP.  Last appointment, I walked him and his son to [facility] and they gave him a doctor's number that is taking patients but son states that he has not had time to take his father. He is out of all diabetes medications. He is not checking his blood glucose. Blood glucose in office is 99 this am. Previously used urgent care.  I called [facility] and they were helpful to find patient a doctor who is taking patients.  He now has an appointment and his son states that he is able to take him.  Referral:  Type 2 Diabetes (newly diagnosed)   History includes:  Type 2 Diabetes (11/2023), HTN Medications include:  Glipizide , Metformin   (stopped all meds as he does not have refills) Labs:  11% 11/19/2023   64 152 lbs 05/13/2024 151 lbs 03/11/2024 147 lbs 01/05/2024  lost weight due to diet changes 160 lbs  11/19/2023    Patient lives with his wife and son.  Patient does some of the shopping and patient does the cooking.  He is not employed. No alcohol or smoking since 1991. He is walking 10-20 minutes per day. Weight 152 lb (68.9 kg). Body mass index is 26.09 kg/m.   Diabetes Self-Management Education - 05/13/24 1047       Visit Information   Visit Type Follow-up      Psychosocial Assessment   Patient Belief/Attitude about Diabetes Denial    What is the hardest part about your diabetes right now, causing you the most concern, or is the most worrisome to you about your diabetes?   Taking/obtaining  medications;Checking blood sugar;Getting support / problem solving    Self-care barriers English as a second language    Self-management support CDE visits    Other persons present Patient;Family Member    Patient Concerns Problem Solving    Special Needs None    Preferred Learning Style No preference indicated    Learning Readiness Ready    How often do you need to have someone help you when you read instructions, pamphlets, or other written materials from your doctor or pharmacy? 5 - Always      Pre-Education Assessment   Patient understands the diabetes disease and treatment process. Needs Review    Patient understands incorporating nutritional management into lifestyle. Needs Review    Patient undertands incorporating physical activity into lifestyle. Needs Review    Patient understands using medications safely. Needs Review    Patient understands monitoring blood glucose, interpreting and using results Needs Review    Patient understands prevention, detection, and treatment of acute complications. Needs Review    Patient understands prevention, detection, and treatment of chronic complications. Needs Review    Patient understands how to develop strategies to address psychosocial issues. Needs Review    Patient understands how to develop strategies to promote health/change behavior. Needs Review      Complications   How often do you check your blood sugar? 0 times/day (not  testing)      Dietary Intake   Breakfast apple    Snack (morning) none    Lunch white rice, fish, frog legs, vegetables    Snack (afternoon) orange, apple    Dinner 2 ears corn, rice, vegetables    Snack (evening) none    Beverage(s) water, unsweetened hot tea      Activity / Exercise   Activity / Exercise Type Light (walking / raking leaves)    How many days per week do you exercise? 3    How many minutes per day do you exercise? 30    Total minutes per week of exercise 90      Patient Education    Previous Diabetes Education Yes   02/2024   Healthy Eating Meal options for control of blood glucose level and chronic complications.    Being Active Role of exercise on diabetes management, blood pressure control and cardiac health.    Monitoring Identified appropriate SMBG and/or A1C goals.;Taught/evaluated SMBG meter.    Chronic complications Relationship between chronic complications and blood glucose control    Diabetes Stress and Support Identified and addressed patients feelings and concerns about diabetes;Worked with patient to identify barriers to care and solutions;Helped patient identify a support system for diabetes management      Individualized Goals (developed by patient)   Nutrition General guidelines for healthy choices and portions discussed    Physical Activity Exercise 5-7 days per week;30 minutes per day    Medications take my medication as prescribed    Monitoring  Test my blood glucose as discussed    Problem Solving Addressing barriers to behavior change    Reducing Risk do foot checks daily;treat hypoglycemia with 15 grams of carbs if blood glucose less than 70mg /dL      Patient Self-Evaluation of Goals - Patient rates self as meeting previously set goals (% of time)   Nutrition >75% (most of the time)    Physical Activity 50 - 75 % (half of the time)    Medications < 25% (hardly ever/never)    Monitoring < 25% (hardly ever/never)    Problem Solving and behavior change strategies  < 25% (hardly ever/never)    Reducing Risk (treating acute and chronic complications) < 25% (hardly ever/never)    Health Coping 25 - 50% (sometimes)      Post-Education Assessment   Patient understands the diabetes disease and treatment process. Needs Review    Patient understands incorporating nutritional management into lifestyle. Needs Review    Patient undertands incorporating physical activity into lifestyle. Needs Review    Patient understands using medications safely. Needs Review     Patient understands monitoring blood glucose, interpreting and using results Needs Review    Patient understands prevention, detection, and treatment of acute complications. Needs Review    Patient understands prevention, detection, and treatment of chronic complications. Needs Review    Patient understands how to develop strategies to address psychosocial issues. Needs Review    Patient understands how to develop strategies to promote health/change behavior. Needs Review      Outcomes   Expected Outcomes Demonstrated interest in learning but significant barriers to change    Future DMSE 3-4 months    Program Status Not Completed      Subsequent Visit   Since your last visit have you continued or begun to take your medications as prescribed? No    Since your last visit have you experienced any weight changes? No change  Since your last visit, are you checking your blood glucose at least once a day? No          Individualized Plan for Diabetes Self-Management Training:   Learning Objective:  Patient will have a greater understanding of diabetes self-management. Patient education plan is to attend individual and/or group sessions per assessed needs and concerns.   Plan:   Patient Instructions  Blood glucose goals:  80-130 fasting   100-180 two hours after starting any meal  Generally a rise of 40-60 points after a meal is normal  A1C Goal:   Less than 7%.  Your last A1C was  Lab Results  Component Value Date   HGBA1C 11.0 (H) 11/19/2023     Continue to eat a healthy diet Be active for at least 30 minutes most days. Drink water and unsweetened tea Check your blood glucose daily  Keep your doctor's appointment   Expected Outcomes:  Demonstrated interest in learning but significant barriers to change  Education material provided:   If problems or questions, patient to contact team via:  Phone  Future DSME appointment: 3-4 months

## 2024-06-13 ENCOUNTER — Ambulatory Visit: Admitting: Internal Medicine

## 2024-07-20 ENCOUNTER — Ambulatory Visit: Admitting: Internal Medicine

## 2024-08-29 ENCOUNTER — Encounter: Payer: Self-pay | Admitting: Dietician

## 2024-08-29 ENCOUNTER — Encounter: Attending: Nurse Practitioner | Admitting: Dietician

## 2024-08-29 VITALS — Wt 153.0 lb

## 2024-08-29 DIAGNOSIS — E119 Type 2 diabetes mellitus without complications: Secondary | ICD-10-CM | POA: Insufficient documentation

## 2024-08-29 NOTE — Progress Notes (Signed)
 "  Diabetes Self-Management Education  Visit Type: Follow-up  Appt. Start Time: 0905 Appt. End Time: 0935  08/29/2024  [name], identified by name and date of birth, is a 59 y.o. male with a diagnosis of Diabetes:  .   ASSESSMENT Patient is here today with his son.  Patient understands some English but patient's son interpreted a little.  They were last seen by this RD on 05/13/2024.   He has not been to a doctor and continues to be out of medications for diabetes.  His blood glucose in the office in 174.  He ate an orange this am. His son states that he has not had anyone to take him to the appointments.  The doctor was recommended by another son and son present today has spoken about another office for the patient to be seen at.  I discussed importance of medical care and blood glucose control. He does walk twice per day for 30 minutes in the morning and 60 minutes in the afternoon.   Referral:  Type 2 Diabetes (newly diagnosed)   History includes:  Type 2 Diabetes (11/2023), HTN Medications include:  Glipizide , Metformin   (stopped all meds as he does not have refills) Labs:  11% 11/19/2023   64 154 lbs 08/29/24 152 lbs 05/13/2024 151 lbs 03/11/2024 147 lbs 01/05/2024  lost weight due to diet changes 160 lbs  11/19/2023    Patient lives with his wife and son.  Patient does some of the shopping and patient does the cooking.  He is not employed. No alcohol or smoking since 1991. He is walking 10-20 minutes per day. Weight 153 lb (69.4 kg). Body mass index is 26.26 kg/m.   Diabetes Self-Management Education - 08/29/24 0945       Visit Information   Visit Type Follow-up      Psychosocial Assessment   Patient Belief/Attitude about Diabetes Denial    What is the hardest part about your diabetes right now, causing you the most concern, or is the most worrisome to you about your diabetes?   Taking/obtaining medications    Self-care barriers English as a second language     Self-management support CDE visits    Other persons present Patient;Family Member    Patient Concerns Problem Solving;Support    Special Needs Instruct caregiver    Preferred Learning Style Visual    Learning Readiness Ready    How often do you need to have someone help you when you read instructions, pamphlets, or other written materials from your doctor or pharmacy? 5 - Always      Pre-Education Assessment   Patient understands the diabetes disease and treatment process. Needs Review    Patient understands incorporating nutritional management into lifestyle. Needs Review    Patient undertands incorporating physical activity into lifestyle. Needs Review    Patient understands using medications safely. Needs Review    Patient understands monitoring blood glucose, interpreting and using results Needs Review    Patient understands prevention, detection, and treatment of acute complications. Needs Review    Patient understands prevention, detection, and treatment of chronic complications. Needs Review    Patient understands how to develop strategies to address psychosocial issues. Needs Review    Patient understands how to develop strategies to promote health/change behavior. Needs Review      Complications   Postprandial Blood glucose range (mg/dL) 869-820      Dietary Intake   Breakfast orange, unsweetened tea    Snack (morning) none  Lunch white rice, grilled fish, chicken, squash    Snack (afternoon) none    Dinner white rice, grilled fish, chicken, squash    Snack (evening) none    Beverage(s) water, unsweetened tea      Activity / Exercise   Activity / Exercise Type Light (walking / raking leaves)    How many days per week do you exercise? 7    How many minutes per day do you exercise? 90    Total minutes per week of exercise 630      Patient Education   Previous Diabetes Education Yes   04/2024   Disease Pathophysiology Definition of diabetes, type 1 and 2, and the  diagnosis of diabetes;Explored patient's options for treatment of their diabetes    Healthy Eating Role of diet in the treatment of diabetes and the relationship between the three main macronutrients and blood glucose level    Being Active Role of exercise on diabetes management, blood pressure control and cardiac health.    Medications Reviewed patients medication for diabetes, action, purpose, timing of dose and side effects.    Monitoring Identified appropriate SMBG and/or A1C goals.    Acute complications Discussed and identified patients' prevention, symptoms, and treatment of hyperglycemia.    Chronic complications Relationship between chronic complications and blood glucose control    Diabetes Stress and Support Identified and addressed patients feelings and concerns about diabetes;Worked with patient to identify barriers to care and solutions      Individualized Goals (developed by patient)   Nutrition General guidelines for healthy choices and portions discussed    Physical Activity Exercise 5-7 days per week;60 minutes per day    Medications Not Applicable;take my medication as prescribed    Monitoring  Test my blood glucose as discussed    Problem Solving Medication consistency;Addressing barriers to behavior change    Reducing Risk examine blood glucose patterns;do foot checks daily;treat hypoglycemia with 15 grams of carbs if blood glucose less than 70mg /dL      Patient Self-Evaluation of Goals - Patient rates self as meeting previously set goals (% of time)   Nutrition >75% (most of the time)    Physical Activity >75% (most of the time)    Medications < 25% (hardly ever/never)    Monitoring < 25% (hardly ever/never)    Reducing Risk (treating acute and chronic complications) < 25% (hardly ever/never)    Health Coping < 25% (hardly ever/never)      Post-Education Assessment   Patient understands the diabetes disease and treatment process. Comprehends key points    Patient  understands incorporating nutritional management into lifestyle. Comprehends key points    Patient undertands incorporating physical activity into lifestyle. Comprehends key points    Patient understands using medications safely. Needs Review    Patient understands monitoring blood glucose, interpreting and using results Needs Review    Patient understands prevention, detection, and treatment of acute complications. Comprehends key points    Patient understands prevention, detection, and treatment of chronic complications. Comprehends key points    Patient understands how to develop strategies to address psychosocial issues. Comprehends key points    Patient understands how to develop strategies to promote health/change behavior. Needs Review      Outcomes   Expected Outcomes Demonstrated interest in learning but significant barriers to change    Future DMSE PRN    Program Status Completed      Subsequent Visit   Since your last visit have you experienced any weight  changes? Gain    Weight Gain (lbs) 2          Individualized Plan for Diabetes Self-Management Training:   Learning Objective:  Patient will have a greater understanding of diabetes self-management. Patient education plan is to attend individual and/or group sessions per assessed needs and concerns.   Plan:   There are no Patient Instructions on file for this visit.  Expected Outcomes:  Demonstrated interest in learning but significant barriers to change  Education material provided:   If problems or questions, patient to contact team via:  Phone  Future DSME appointment: PRN "
